# Patient Record
Sex: Female | Born: 1980 | Hispanic: Yes | Marital: Married | Smoking: Never smoker
Health system: Southern US, Community
[De-identification: ages and names within clinical notes are randomized; demographics above are authoritative.]

## PROBLEM LIST (undated history)

## (undated) DIAGNOSIS — E079 Disorder of thyroid, unspecified: Secondary | ICD-10-CM

## (undated) HISTORY — PX: FRACTURE SURGERY: SHX138

---

## 2009-07-07 ENCOUNTER — Ambulatory Visit: Payer: Self-pay | Admitting: Internal Medicine

## 2011-04-22 IMAGING — MG MAM [PERSON_NAME] DIAGNOSTIC W/CAD
1 series · 6 of 6 positions shown · non-contrast
Comparison: none

REASON FOR EXAM: Paulus N Ceejay  RT. BR PAIN Femyfefem [place] [phone]
COMMENTS:

[Series 37: R CC · right · 6 of 6 slices shown]
[im 1/6]
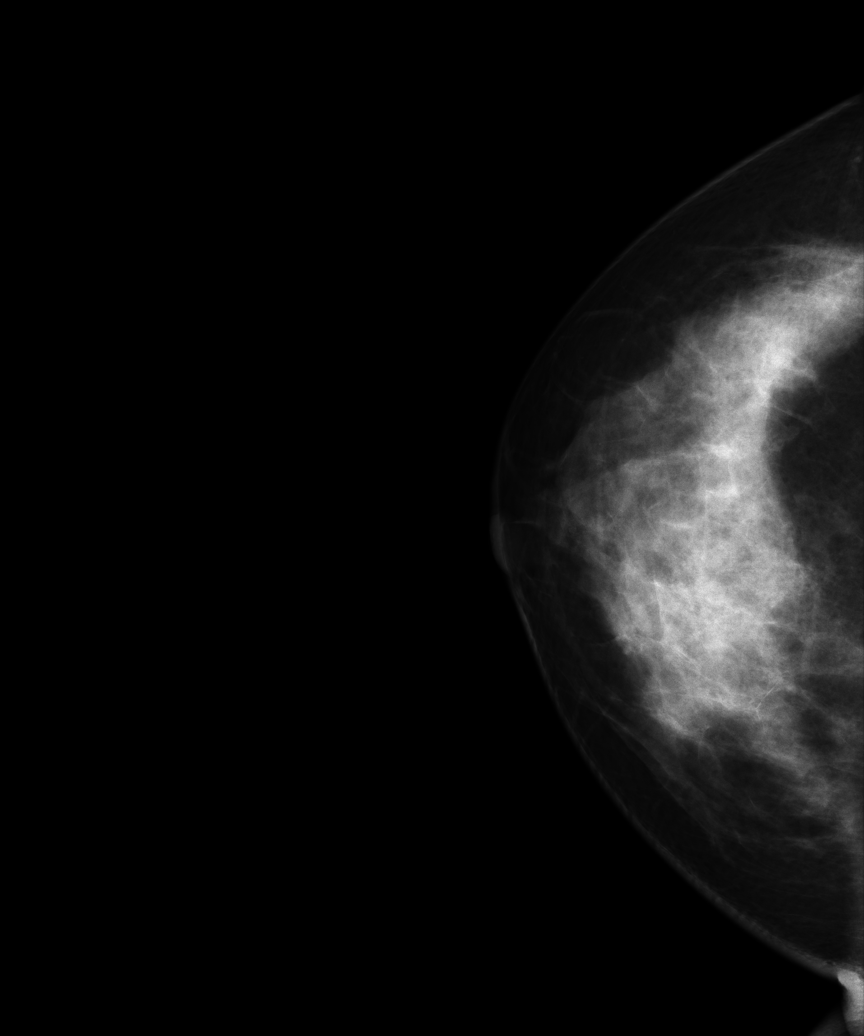
[im 2/6]
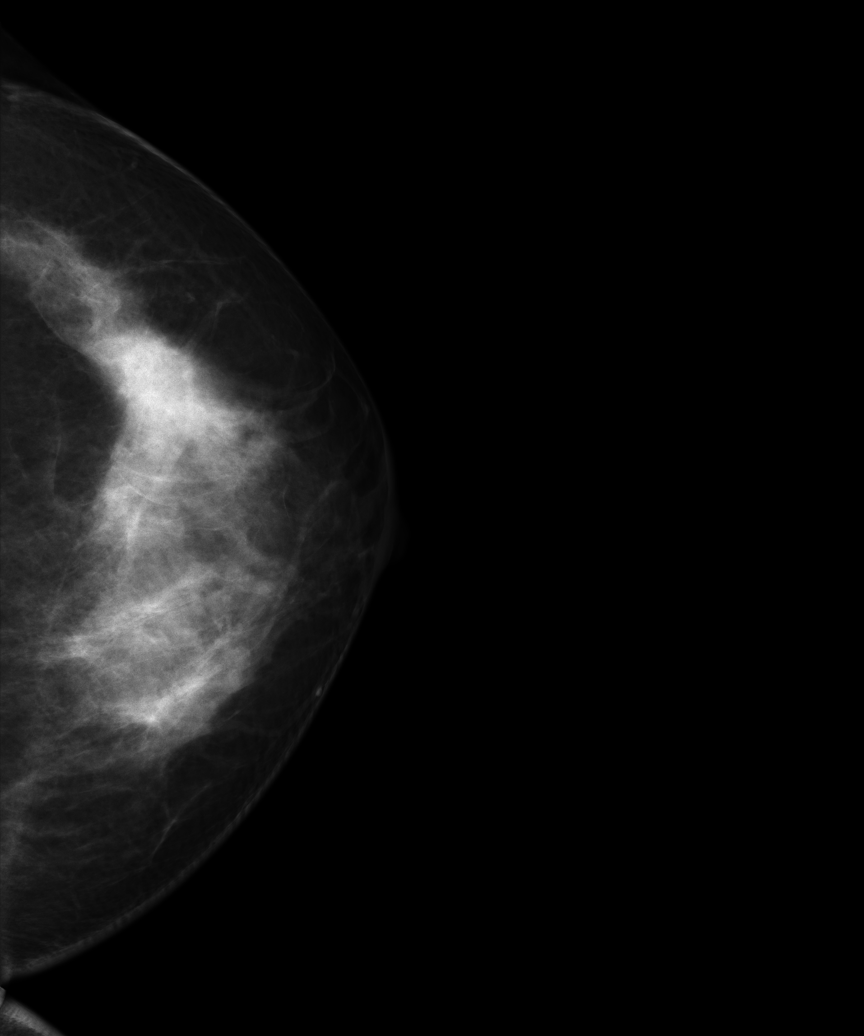
[im 3/6]
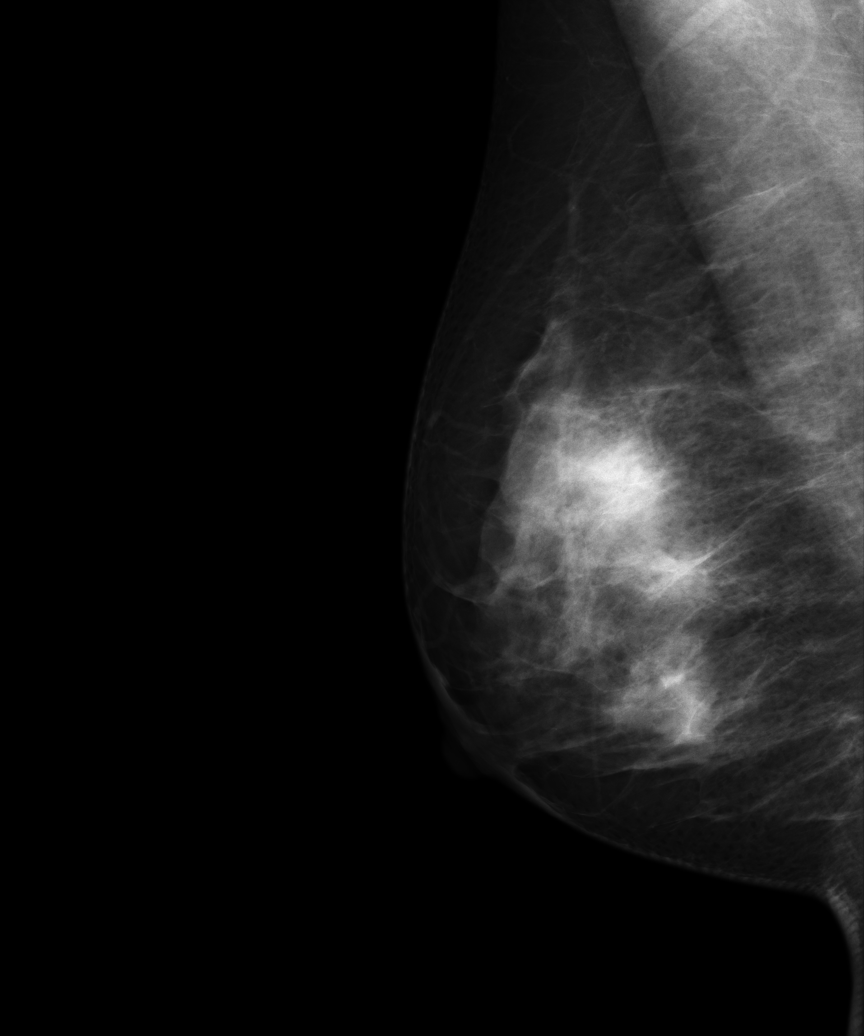
[im 4/6]
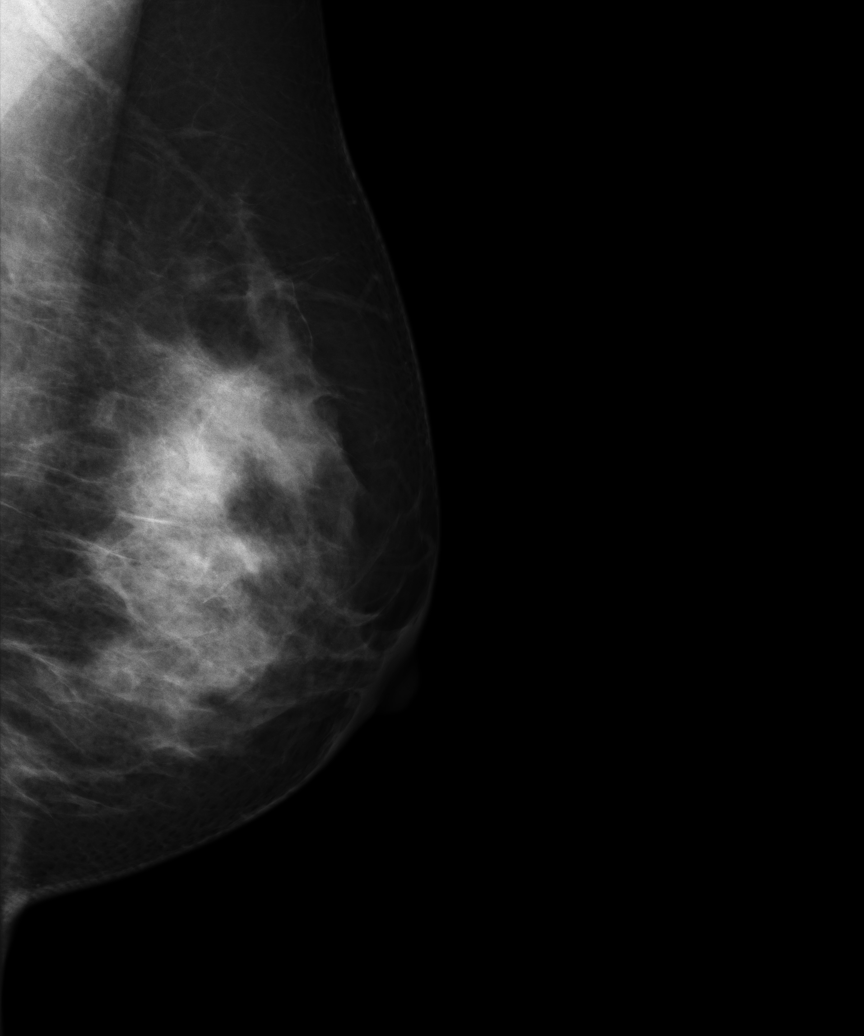
[im 5/6]
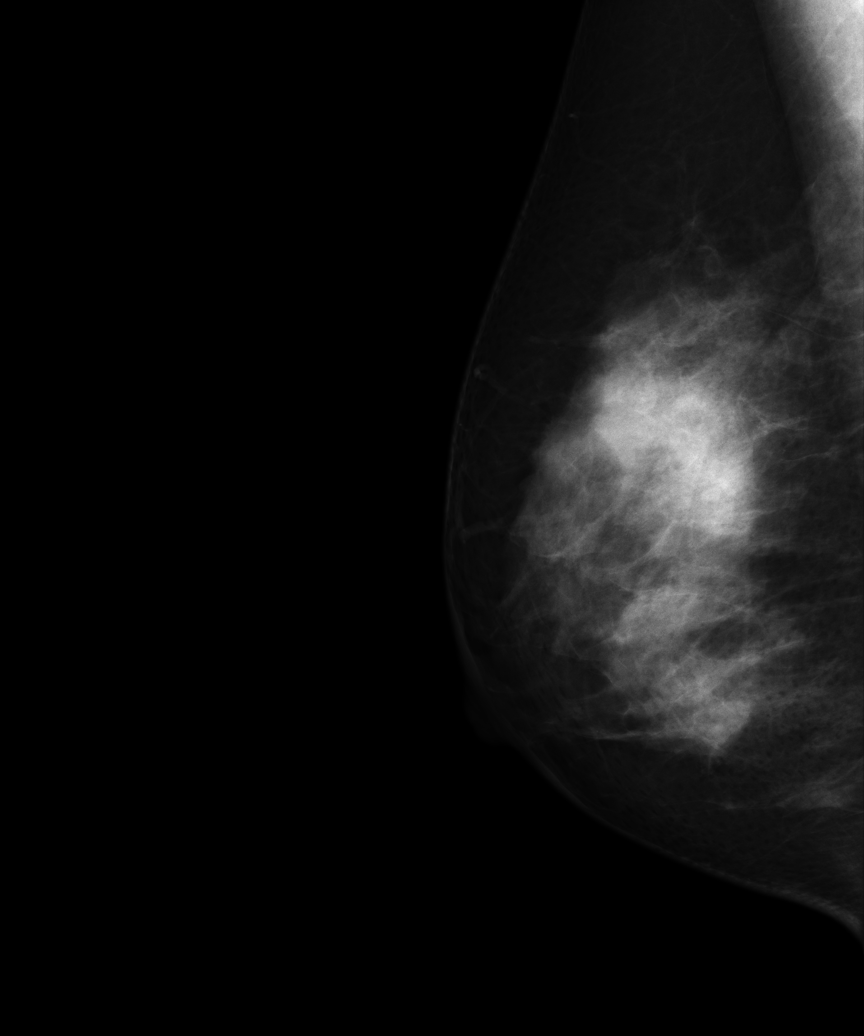
[im 6/6]
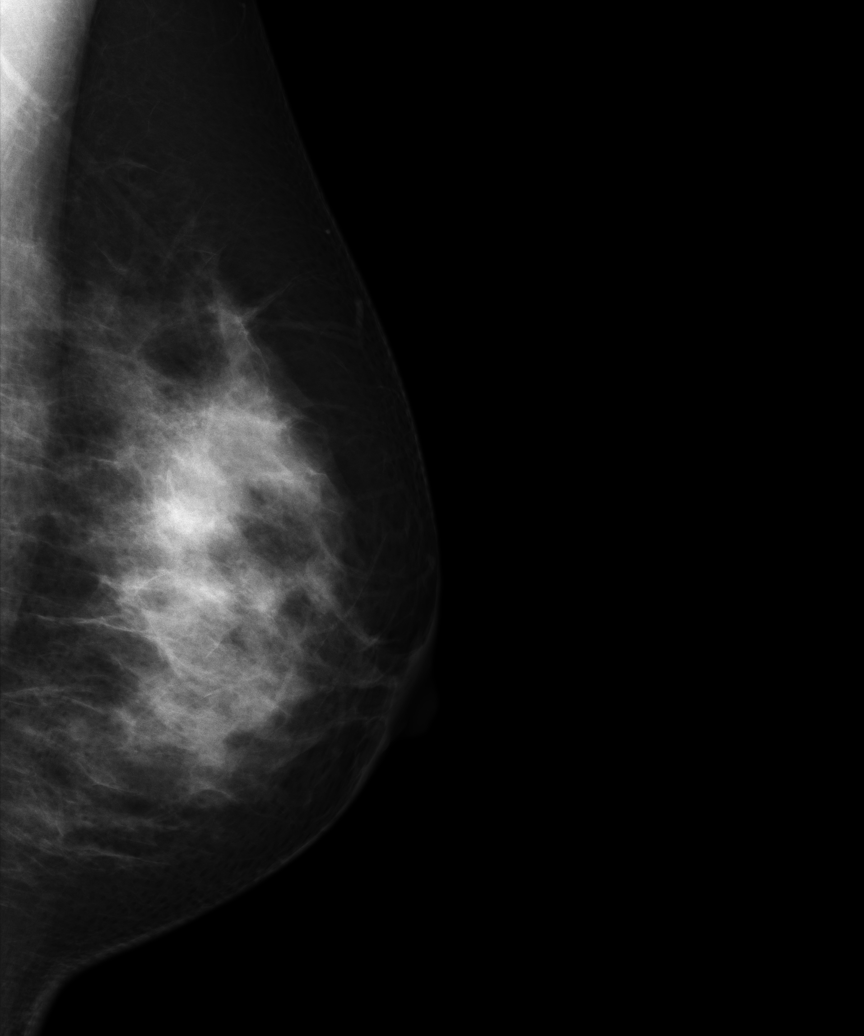

[6 of 6 positions shown; findings below may reference images not displayed]

PROCEDURE:     MAM - MAM MAFEE RESTAN DIAGNOSTIC W/CAD  - July 07, 2009 [DATE]

RESULT:       There are no prior exams available for comparison.

The breasts demonstrate a moderately to mildly severely dense parenchymal
pattern with a component of heterogeneity.  There is no mammographic
evidence to suggest malignancy.
IMPRESSION: BI-RADS:  Category 2- Benign Finding.

A negative mammogram report does not preclude biopsy or other evaluation of
a clinically palpable or otherwise suspicious mass or lesion. Breast cancer
may not be detected by mammography in up to 10% of cases.

## 2018-02-20 ENCOUNTER — Ambulatory Visit
Admission: EM | Admit: 2018-02-20 | Discharge: 2018-02-20 | Disposition: A | Discharge status: Home / Self Care | Payer: Self-pay | Attending: Family Medicine | Admitting: Family Medicine

## 2018-02-20 ENCOUNTER — Encounter: Payer: Self-pay | Admitting: Emergency Medicine

## 2018-02-20 ENCOUNTER — Other Ambulatory Visit: Payer: Self-pay

## 2018-02-20 DIAGNOSIS — M545 Low back pain, unspecified: Secondary | ICD-10-CM

## 2018-02-20 HISTORY — DX: Disorder of thyroid, unspecified: E07.9

## 2018-02-20 MED ORDER — MELOXICAM 15 MG PO TABS: 15.0000 mg | ORAL_TABLET | Freq: Every day | ORAL | 0 refills | Status: DC

## 2018-02-20 MED ORDER — CYCLOBENZAPRINE HCL 10 MG PO TABS: 10.0000 mg | ORAL_TABLET | Freq: Every day | ORAL | 0 refills | Status: DC

## 2018-02-20 NOTE — ED Triage Notes (Signed)
Pt c/o lower back pain. She reports that she went ice skating and fell 3 times. She reports that she lifts heavy things at work and had to leave work due to the pain. Injury occurred about 1 month ago. She has seen her PCP and treated with Naproxen without relief.

## 2018-02-20 NOTE — ED Provider Notes (Signed)
[facility]    CSN: [id number] Arrival date & time: 02/20/18  1310     History   Chief Complaint Chief Complaint  Patient presents with  . Back Pain    HPI Mallory Fox is a 37 y.o. female.   37 yo female with a c/o low back pain for 3 weeks since falling while ice skating. States that bending and lifting at work has not helped either. Patient has been seen for this at PCP's office and had negative lumbar x-rays. Denies any bowel/bladder problems, pain radiating, tingling, saddle anesthesia.   The history is provided by the patient.    Past Medical History:  Diagnosis Date  . Thyroid disease     There are no active problems to display for this patient.   Past Surgical History:  Procedure Laterality Date  . CESAREAN SECTION    . FRACTURE SURGERY      OB History   No obstetric history on file.      Home Medications    Prior to Admission medications   Medication Sig Start Date End Date Taking? Authorizing Provider  hydrOXYzine (ATARAX/VISTARIL) 25 MG tablet Take 25 mg by mouth daily.   Yes [provider]  levothyroxine (SYNTHROID, LEVOTHROID) 75 MCG tablet Take 75 mcg by mouth daily before breakfast.   Yes [provider]  naproxen (NAPROSYN) 500 MG tablet Take 500 mg by mouth 2 (two) times daily with a meal.   Yes [provider]  cyclobenzaprine (FLEXERIL) 10 MG tablet Take 1 tablet (10 mg total) by mouth at bedtime. 02/20/18   Payton Mccallumonty, Destine Ambroise, MD  meloxicam (MOBIC) 15 MG tablet Take 1 tablet (15 mg total) by mouth daily. 02/20/18   Payton Mccallumonty, Dotsie Gillette, MD    Family History Family History  Problem Relation Age of Onset  . Diabetes Mother   . Hypertension Father   . Hyperlipidemia Father     Social History Social History   Tobacco Use  . Smoking status: Never Smoker  . Smokeless tobacco: Never Used  Substance Use Topics  . Alcohol use: Yes    Frequency: Never  . Drug use: Never     Allergies     Sulfa antibiotics   Review of Systems Review of Systems   Physical Exam Triage Vital Signs ED Triage Vitals  Enc Vitals Group     BP 02/20/18 1334 112/76     Pulse Rate 02/20/18 1334 88     Resp 02/20/18 1334 18     Temp 02/20/18 1334 98.6 F (37 C)     Temp Source 02/20/18 1334 Oral     SpO2 02/20/18 1334 100 %     Weight 02/20/18 1326 165 lb (74.8 kg)     Height 02/20/18 1326 5\' 3"  (1.6 m)     Head Circumference --      Peak Flow --      Pain Score 02/20/18 1326 6     Pain Loc --      Pain Edu? --      Excl. in GC? --    No data found.  Updated Vital Signs BP 112/76 (BP Location: Left Arm)   Pulse 88   Temp 98.6 F (37 C) (Oral)   Resp 18   Ht 5\' 3"  (1.6 m)   Wt 74.8 kg   LMP 01/28/2018   SpO2 100%   BMI 29.23 kg/m   Visual Acuity Right Eye Distance:   Left Eye Distance:   Bilateral Distance:  Right Eye Near:   Left Eye Near:    Bilateral Near:     Physical Exam Vitals signs and nursing note reviewed.  Constitutional:      General: She is not in acute distress.    Appearance: She is well-developed. She is not diaphoretic.  Musculoskeletal:        General: Tenderness present.     Lumbar back: She exhibits tenderness and spasm. She exhibits normal range of motion, no bony tenderness, no swelling, no edema, no deformity, no laceration, no pain and normal pulse.  Skin:    General: Skin is warm and dry.     Findings: No erythema or rash.  Neurological:     Mental Status: She is alert.     Motor: No abnormal muscle tone.     Deep Tendon Reflexes: Reflexes are normal and symmetric. Reflexes normal.      UC Treatments / Results  Labs (all labs ordered are listed, but only abnormal results are displayed) Labs Reviewed - No data to display  EKG None  Radiology No results found.  Procedures Procedures (including critical care time)  Medications Ordered in UC Medications - No data to display  Initial Impression / Assessment and Plan /  UC Course  I have reviewed the triage vital signs and the nursing notes.  Pertinent labs & imaging results that were available during my care of the patient were reviewed by me and considered in my medical decision making (see chart for details).      Final Clinical Impressions(s) / UC Diagnoses   Final diagnoses:  Acute low back pain without sciatica, unspecified back pain laterality    ED Prescriptions    Medication Sig Dispense Auth. Provider   cyclobenzaprine (FLEXERIL) 10 MG tablet Take 1 tablet (10 mg total) by mouth at bedtime. 30 tablet Payton Mccallumonty, Jarelyn Bambach, MD   meloxicam (MOBIC) 15 MG tablet Take 1 tablet (15 mg total) by mouth daily. 30 tablet Payton Mccallumonty, Taesean Reth, MD     1. diagnosis reviewed with patient 2. rx as per orders above; reviewed possible side effects, interactions, risks and benefits  3. Recommend supportive treatment with heat/ice; no heavy lifting (over 5 lbs) 4. Follow up with PCP for further evaluation, possible referrals for MRI and/or specialist 5. Follow-up prn    Controlled Substance Prescriptions [place] Controlled Substance Registry consulted? Not Applicable   Payton Mccallumonty, Rashan Rounsaville, MD 02/20/18 1537

## 2018-03-01 HISTORY — PX: BREAST BIOPSY: SHX20

## 2018-11-24 ENCOUNTER — Ambulatory Visit: Payer: Self-pay

## 2018-11-24 ENCOUNTER — Telehealth: Payer: Self-pay

## 2018-11-24 NOTE — Telephone Encounter (Signed)
Client scheduled this pm in Nurse Clinic for polio titer. This is not a service provided by the [facility] and [facility] does not perform this test. Google search done and [facility] located on [address] in [place] (one not avialable in [place]). Phone call to [facility] and per female, client must bring MD order for polio titer, ID and insurance card (cost of test ~ $460). Appt is also needed. Above information given to client who states will get order from her doctor at [facility] and client states she does have insurance. Rich Number, RN

## 2019-02-06 ENCOUNTER — Other Ambulatory Visit: Payer: Self-pay | Admitting: Family Medicine

## 2019-02-06 DIAGNOSIS — Z1231 Encounter for screening mammogram for malignant neoplasm of breast: Secondary | ICD-10-CM

## 2019-02-14 ENCOUNTER — Inpatient Hospital Stay
Admission: RE | Admit: 2019-02-14 | Discharge: 2019-02-14 | Disposition: A | Discharge status: Home / Self Care | Payer: Self-pay | Source: Ambulatory Visit | Attending: Family Medicine | Admitting: Family Medicine

## 2019-02-14 ENCOUNTER — Other Ambulatory Visit: Payer: Self-pay | Admitting: Family Medicine

## 2019-02-14 ENCOUNTER — Other Ambulatory Visit: Payer: Self-pay | Admitting: *Deleted

## 2019-02-14 ENCOUNTER — Inpatient Hospital Stay
Admission: RE | Admit: 2019-02-14 | Discharge: 2019-02-14 | Disposition: A | Discharge status: Home / Self Care | Payer: Self-pay | Source: Ambulatory Visit | Attending: *Deleted | Admitting: *Deleted

## 2019-02-14 DIAGNOSIS — Z1231 Encounter for screening mammogram for malignant neoplasm of breast: Secondary | ICD-10-CM

## 2019-04-16 ENCOUNTER — Ambulatory Visit
Admission: RE | Admit: 2019-04-16 | Discharge: 2019-04-16 | Disposition: A | Discharge status: Home / Self Care | Payer: 59 | Source: Ambulatory Visit | Attending: Family Medicine | Admitting: Family Medicine

## 2019-04-16 DIAGNOSIS — Z1231 Encounter for screening mammogram for malignant neoplasm of breast: Secondary | ICD-10-CM | POA: Insufficient documentation

## 2019-05-30 ENCOUNTER — Ambulatory Visit: Payer: 59 | Attending: Internal Medicine

## 2019-05-30 DIAGNOSIS — Z23 Encounter for immunization: Secondary | ICD-10-CM

## 2019-05-30 NOTE — Progress Notes (Signed)
   Covid-19 Vaccination Clinic  Name:  Mallory Fox    MRN: [id number] DOB: 1980-07-15  05/30/2019  Ms. Mallory Fox was observed post Covid-19 immunization for 15 minutes without incident. She was provided with Vaccine Information Sheet and instruction to access the V-Safe system.   Ms. Mallory Fox was instructed to call 911 with any severe reactions post vaccine: Marland Kitchen Difficulty breathing  . Swelling of face and throat  . A fast heartbeat  . A bad rash all over body  . Dizziness and weakness   Immunizations Administered    Name Date Dose VIS Date Route   Pfizer COVID-19 Vaccine 05/30/2019 12:23 PM 0.3 mL 02/09/2019 Intramuscular   Manufacturer: [employer], [employer]   Lot: [id number]   NDC: [address]-3

## 2019-06-25 ENCOUNTER — Ambulatory Visit: Payer: 59 | Attending: Internal Medicine

## 2019-06-25 DIAGNOSIS — Z23 Encounter for immunization: Secondary | ICD-10-CM

## 2019-06-25 NOTE — Progress Notes (Signed)
   Covid-19 Vaccination Clinic  Name:  Mallory Fox    MRN: [id number] DOB: 1980-03-26  06/25/2019  Ms. Mallory Fox was observed post Covid-19 immunization for 15 minutes without incident. She was provided with Vaccine Information Sheet and instruction to access the V-Safe system.   Ms. Mallory Fox was instructed to call 911 with any severe reactions post vaccine: Marland Kitchen Difficulty breathing  . Swelling of face and throat  . A fast heartbeat  . A bad rash all over body  . Dizziness and weakness   Immunizations Administered    Name Date Dose VIS Date Route   Pfizer COVID-19 Vaccine 06/25/2019  8:59 AM 0.3 mL 04/25/2018 Intramuscular   Manufacturer: [employer], [employer]   Lot: [id number]   NDC: [address]-8

## 2021-02-03 ENCOUNTER — Other Ambulatory Visit: Payer: Self-pay

## 2021-02-03 ENCOUNTER — Encounter: Payer: Self-pay | Admitting: Emergency Medicine

## 2021-02-03 ENCOUNTER — Ambulatory Visit
Admission: EM | Admit: 2021-02-03 | Discharge: 2021-02-03 | Disposition: A | Discharge status: Home / Self Care | Payer: Managed Care, Other (non HMO)

## 2021-02-03 DIAGNOSIS — J302 Other seasonal allergic rhinitis: Secondary | ICD-10-CM

## 2021-02-03 DIAGNOSIS — J40 Bronchitis, not specified as acute or chronic: Secondary | ICD-10-CM | POA: Diagnosis not present

## 2021-02-03 NOTE — Discharge Instructions (Addendum)
Rest,push fluids,please take daily allergy med of your choice(Claritin, zyrtec, allegra). Continue allergy spray daily, use inhaler only for wheezing. We have referred you to pulmonologist for further evaluation. You were 100% on room air today in Urgent care, no respiratory distress or wheezing.

## 2021-02-03 NOTE — ED Triage Notes (Signed)
Pt c/o cough, wheezing. Started about 6 weeks ago. She states she has seen multiple providers and is still coughing and not feeling well. Denies fever.

## 2021-02-03 NOTE — ED Provider Notes (Signed)
[facility]    CSN: [id number] Arrival date & time: 02/03/21  1801      History   Chief Complaint Chief Complaint  Patient presents with   Cough    HPI Mallory Fox is a 40 y.o. female.   40 year old female, Mallory Fox, presents to urgent care chief complaint of coughing x6 weeks.  Patient states she works at [employer] services(PMH) and has seen several providers: PMH facility, [facility] and here. Pt has had chest xray that was negative. Pt states she has taken steroids, inhaler, cough medication, nasal spray and still coughs at work sometimes. Pt states her "coworkers are causing problems at work because I cough", pt wears mask at work all day. VSS in UC today, exam is essentially negative(no cough,no wheezing, R 18, sat 100% on RA. Pt states she needs a note to return to work.   The history is provided by the patient. No language interpreter was used.   Past Medical History:  Diagnosis Date   Thyroid disease     Patient Active Problem List   Diagnosis Date Noted   Bronchitis 02/03/2021   Seasonal allergies 02/03/2021    Past Surgical History:  Procedure Laterality Date   BREAST BIOPSY Right 2020   benign   CESAREAN SECTION     FRACTURE SURGERY      OB History   No obstetric history on file.      Home Medications    Prior to Admission medications   Medication Sig Start Date End Date Taking? Authorizing Provider  hydrOXYzine (ATARAX/VISTARIL) 25 MG tablet Take 25 mg by mouth daily.   Yes [provider]  levothyroxine (SYNTHROID, LEVOTHROID) 75 MCG tablet Take 75 mcg by mouth daily before breakfast.   Yes [provider]  cyclobenzaprine (FLEXERIL) 10 MG tablet Take 1 tablet (10 mg total) by mouth at bedtime. 02/20/18   Payton Mccallum, MD  meloxicam (MOBIC) 15 MG tablet Take 1 tablet (15 mg total) by mouth daily. 02/20/18   Payton Mccallum, MD  naproxen (NAPROSYN) 500 MG tablet Take 500 mg  by mouth 2 (two) times daily with a meal.    [provider]    Family History Family History  Problem Relation Age of Onset   Diabetes Mother    Hypertension Father    Hyperlipidemia Father    Breast cancer Sister 25    Social History Social History   Tobacco Use   Smoking status: Never   Smokeless tobacco: Never  Vaping Use   Vaping Use: Never used  Substance Use Topics   Alcohol use: Yes   Drug use: Never     Allergies   Sulfa antibiotics   Review of Systems Review of Systems  Respiratory:  Positive for cough, shortness of breath and wheezing.   Cardiovascular:  Negative for chest pain and palpitations.  All other systems reviewed and are negative.   Physical Exam Triage Vital Signs ED Triage Vitals  Enc Vitals Group     BP 02/03/21 1827 117/77     Pulse Rate 02/03/21 1827 83     Resp 02/03/21 1827 18     Temp 02/03/21 1827 99.1 F (37.3 C)     Temp Source 02/03/21 1827 Oral     SpO2 02/03/21 1827 100 %     Weight 02/03/21 1825 164 lb 14.5 oz (74.8 kg)     Height 02/03/21 1825 5\' 3"  (1.6 m)     Head Circumference --  Peak Flow --      Pain Score 02/03/21 1824 0     Pain Loc --      Pain Edu? --      Excl. in GC? --    No data found.  Updated Vital Signs BP 117/77 (BP Location: Left Arm)   Pulse 83   Temp 99.1 F (37.3 C) (Oral)   Resp 18   Ht 5\' 3"  (1.6 m)   Wt 164 lb 14.5 oz (74.8 kg)   LMP 01/14/2021 (Approximate)   SpO2 100%   BMI 29.21 kg/m   Visual Acuity Right Eye Distance:   Left Eye Distance:   Bilateral Distance:    Right Eye Near:   Left Eye Near:    Bilateral Near:     Physical Exam Vitals and nursing note reviewed.  Constitutional:      General: She is not in acute distress.    Appearance: She is well-developed and well-groomed.  HENT:     Head: Normocephalic.     Right Ear: Tympanic membrane is retracted.     Left Ear: Tympanic membrane is retracted.     Nose: Congestion present. No rhinorrhea.      Mouth/Throat:     Lips: Pink.     Mouth: Mucous membranes are moist.     Pharynx: Oropharynx is clear.  Eyes:     General: Lids are normal.     Extraocular Movements: Extraocular movements intact.     Conjunctiva/sclera: Conjunctivae normal.     Pupils: Pupils are equal, round, and reactive to light.  Neck:     Trachea: No tracheal deviation.  Cardiovascular:     Rate and Rhythm: Regular rhythm.     Pulses: Normal pulses.     Heart sounds: Normal heart sounds. No murmur heard. Pulmonary:     Effort: Pulmonary effort is normal.     Breath sounds: Normal breath sounds.  Abdominal:     General: Bowel sounds are normal.     Palpations: Abdomen is soft.     Tenderness: There is no abdominal tenderness.  Musculoskeletal:        General: Normal range of motion.     Cervical back: Normal range of motion.  Lymphadenopathy:     Cervical: No cervical adenopathy.  Skin:    General: Skin is warm and dry.     Findings: No rash.  Neurological:     General: No focal deficit present.     Mental Status: She is alert and oriented to person, place, and time.     GCS: GCS eye subscore is 4. GCS verbal subscore is 5. GCS motor subscore is 6.  Psychiatric:        Speech: Speech normal.        Behavior: Behavior normal. Behavior is cooperative.     UC Treatments / Results  Labs (all labs ordered are listed, but only abnormal results are displayed) Labs Reviewed - No data to display  EKG   Radiology No results found.  Procedures Procedures (including critical care time)  Medications Ordered in UC Medications - No data to display  Initial Impression / Assessment and Plan / UC Course  I have reviewed the triage vital signs and the nursing notes.  Pertinent labs & imaging results that were available during my care of the patient were reviewed by me and considered in my medical decision making (see chart for details).     Ddx: Chronic cough, bronchitis, allergies,RAD(asthma) Final  Clinical  Impressions(s) / UC Diagnoses   Final diagnoses:  Bronchitis  Seasonal allergies     Discharge Instructions      Rest,push fluids,please take daily allergy med of your choice(Claritin, zyrtec, allegra). Continue allergy spray daily, use inhaler only for wheezing. We have referred you to pulmonologist for further evaluation. You were 100% on room air today in Urgent care, no respiratory distress or wheezing.     ED Prescriptions   None    PDMP not reviewed this encounter.   Clancy Gourd, NP 02/03/21 1902

## 2021-02-12 ENCOUNTER — Other Ambulatory Visit: Payer: Self-pay | Admitting: Nurse Practitioner

## 2021-02-12 DIAGNOSIS — R059 Cough, unspecified: Secondary | ICD-10-CM

## 2021-03-09 ENCOUNTER — Telehealth: Payer: Self-pay | Admitting: Acute Care

## 2021-03-09 NOTE — Telephone Encounter (Signed)
Patient has left message on LCS voicemail.  Attempted to return call. NO answer. Left voicemail with phone number to call us back.

## 2021-03-13 NOTE — Telephone Encounter (Signed)
Returned call to patient regarding a question on LCS.  No answer. Left voicemail and call back number.  No active referral for patient. In review of medical information, patient's smoking history is documented as 'never smoked.'

## 2021-03-27 ENCOUNTER — Ambulatory Visit (INDEPENDENT_AMBULATORY_CARE_PROVIDER_SITE_OTHER): Payer: Managed Care, Other (non HMO) | Admitting: Internal Medicine

## 2021-03-27 ENCOUNTER — Other Ambulatory Visit
Admission: RE | Admit: 2021-03-27 | Discharge: 2021-03-27 | Disposition: A | Discharge status: Home / Self Care | Payer: Managed Care, Other (non HMO) | Source: Ambulatory Visit | Attending: Internal Medicine | Admitting: Internal Medicine

## 2021-03-27 ENCOUNTER — Encounter: Payer: Self-pay | Admitting: Internal Medicine

## 2021-03-27 ENCOUNTER — Other Ambulatory Visit: Payer: Self-pay

## 2021-03-27 DIAGNOSIS — R053 Chronic cough: Secondary | ICD-10-CM | POA: Diagnosis present

## 2021-03-27 LAB — CBC WITH DIFFERENTIAL/PLATELET
Abs Immature Granulocytes: 0.01 10*3/uL (ref 0.00–0.07)
Basophils Absolute: 0.1 10*3/uL (ref 0.0–0.1)
Basophils Relative: 1 %
Eosinophils Absolute: 0.3 10*3/uL (ref 0.0–0.5)
Eosinophils Relative: 5 %
HCT: 39.4 % (ref 36.0–46.0)
Hemoglobin: 12.9 g/dL (ref 12.0–15.0)
Immature Granulocytes: 0 %
Lymphocytes Relative: 32 %
Lymphs Abs: 2 10*3/uL (ref 0.7–4.0)
MCH: 27.1 pg (ref 26.0–34.0)
MCHC: 32.7 g/dL (ref 30.0–36.0)
MCV: 82.8 fL (ref 80.0–100.0)
Monocytes Absolute: 0.5 10*3/uL (ref 0.1–1.0)
Monocytes Relative: 7 %
Neutro Abs: 3.4 10*3/uL (ref 1.7–7.7)
Neutrophils Relative %: 55 %
Platelets: 234 10*3/uL (ref 150–400)
RBC: 4.76 MIL/uL (ref 3.87–5.11)
RDW: 13.6 % (ref 11.5–15.5)
WBC: 6.2 10*3/uL (ref 4.0–10.5)
nRBC: 0 % (ref 0.0–0.2)

## 2021-03-27 MED ORDER — DULERA 100-5 MCG/ACT IN AERO: INHALATION_SPRAY | RESPIRATORY_TRACT | 11 refills | Status: DC

## 2021-03-27 MED ORDER — PREDNISONE 10 MG PO TABS: ORAL_TABLET | ORAL | 0 refills | Status: DC

## 2021-03-27 MED ORDER — ALBUTEROL SULFATE HFA 108 (90 BASE) MCG/ACT IN AERS: INHALATION_SPRAY | RESPIRATORY_TRACT | 1 refills | Status: DC

## 2021-03-27 NOTE — Patient Instructions (Addendum)
Plan A = Automatic = Always=   Dulera 100 Take 2 puffs first thing in am and then another 2 puffs about 12 hours later.    Work on inhaler technique:  relax and gently blow all the way out then take a nice smooth full deep breath back in, triggering the inhaler at same time you start breathing in.  Hold for up to 5 seconds if you can. Blow out thru nose. Rinse and gargle with water when done.  If mouth or throat bother you at all,  try brushing teeth/gums/tongue with arm and hammer toothpaste/ make a slurry and gargle and spit out.   Prednisone 10 mg take  4 each am x 2 days,   2 each am x 2 days,  1 each am x 2 days and stop   Plan B = Backup (to supplement plan A, not to replace it) Only use your albuterol inhaler as a rescue medication to be used if you can't catch your breath by resting or doing a relaxed purse lip breathing pattern.  - The less you use it, the better it will work when you need it. - Ok to use the inhaler up to 2 puffs  every 4 hours if you must but call for appointment if use goes up over your usual need - Don't leave home without it !!  (think of it like the spare tire for your car)   We will walk you before you go to check your exercise tolerance  Please remember to go to the lab department   for your tests - we will call you with the results when they are available.      Please schedule a follow up office visit in 4 weeks, sooner if needed  with all medications /inhalers/ solutions in hand so we can verify exactly what you are taking. This includes all medications from all doctors and over the counters

## 2021-03-27 NOTE — Progress Notes (Signed)
Mallory KavaAdriana Mallory Fox Mallory Fox, female    DOB: 1980/08/23, 41 y.o.   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41 with one episode bad cough in [place] requiring 3-4 m of "shots" and felt fine until [facility]  [facility] [place] [facility]  03/27/2021 by Clancy GourdJeanette Defelice NP  for cough       History of Present Illness  03/27/2021  Pulmonary/ 1st office eval/ Mallory Mallory Fox / [place] [facility]  Chief Complaint  Patient presents with   Consult  Dyspnea:  couple hundred feet  Cough: cold air/ at hs x 5- 10 min then better, no flare in am  Sleep: bed is flat/ one pillow  SABA use: albuterol seems to help uses it up to 2-3 /day   No obvious other patterns in day to day or daytime variability  of cough or assoc excess/ purulent sputum or mucus plugs or hemoptysis or cp or chest tightness, subjective wheeze or overt sinus or hb symptoms.     Also denies any obvious fluctuation of symptoms with weather or environmental changes or other aggravating or alleviating factors except as outlined above   No unusual exposure hx or h/o childhood pna/ asthma or knowledge of premature birth.  Current Allergies, Complete Past Medical History, Past Surgical History, Family History, and Social History were reviewed in [facility] electronic medical record.  ROS  The following are not active complaints unless bolded Hoarseness, sore throat, dysphagia, dental problems, itching, sneezing,  nasal congestion or discharge of excess mucus or purulent secretions, ear ache,   fever, chills, sweats, unintended wt loss or wt gain, classically pleuritic or exertional cp,  orthopnea pnd or arm/hand swelling  or leg swelling, presyncope, palpitations, abdominal pain, anorexia, nausea, vomiting, diarrhea  or change in bowel habits or change in bladder habits, change in stools or  change in urine, dysuria, hematuria,  rash, arthralgias, visual complaints, headache, numbness, weakness or ataxia or problems with walking or coordination,  change in mood or  memory.           Past Medical History:  Diagnosis Date   Thyroid disease     Outpatient Medications Prior to Visit  Medication Sig Dispense Refill   Albuterol Sulfate (PROVENTIL HFA IN) Inhale into the lungs.     Calcium 200 MG TABS Take by mouth.     cyclobenzaprine (FLEXERIL) 10 MG tablet Take 1 tablet (10 mg total) by mouth at bedtime. 30 tablet 0   hydrOXYzine (ATARAX/VISTARIL) 25 MG tablet Take 25 mg by mouth daily.     levothyroxine (SYNTHROID, LEVOTHROID) 75 MCG tablet Take 75 mcg by mouth daily before breakfast.         0             Objective:     BP 100/60 (BP Location: Left Arm, Patient Position: Sitting, Cuff Size: Normal)    Pulse 82    Temp 98.7 F (37.1 C) (Oral)    Ht 5\' 3"  (1.6 m)    Wt 172 lb 3.2 oz (78.1 kg)    SpO2 97%    BMI 30.50 kg/m   SpO2: 97 %  RA  Amb pleasant latina in NAD   HEENT : pt wearing mask not removed for exam due to covid -19 concerns.    NECK :  without JVD/Nodes/TM/ nl carotid upstrokes bilaterally   LUNGS: no acc muscle use,  Nl contour chest which is clear to A and P bilaterally without cough on insp or exp maneuvers   CV:  RRR  no s3 or murmur or increase in P2, and no edema   ABD:  soft and nontender with nl inspiratory excursion in the supine position. No bruits or organomegaly appreciated, bowel sounds nl  MS:  Nl gait/ ext warm without deformities, calf tenderness, cyanosis or clubbing No obvious joint restrictions   SKIN: warm and dry without lesions    NEURO:  alert, approp, nl sensorium with  no motor or cerebellar deficits apparent.       I  reviewed  radiology impression in care everywhere as follows:  CXR:   pa and lat 01/05/21  Wnl     Assessment   Chronic cough Onset  Jan 05 2021 responsive to albuterol  - Allergy profile  03/27/2021 >  Eos 0.3 /  IgE  - 03/27/2021 dulera 100  Take 2 puffs first thing in am and then another 2 puffs about 12 hours later  - 03/27/2021   Walked on RA  x  3  lap(s) =  approx 525  ft  @ mod fast pace, stopped due to end of study with mod sob  with lowest 02 sats 94%   The most common causes of chronic cough in immunocompetent adults include the following: upper airway cough syndrome (UACS), previously referred to as postnasal drip syndrome (PNDS), which is caused by variety of rhinosinus conditions; (2) asthma; (3) GERD; (4) chronic bronchitis from cigarette smoking or other inhaled environmental irritants; (5) nonasthmatic eosinophilic bronchitis; and (6) bronchiectasis.   These conditions, singly or in combination, have accounted for up to 94% of the causes of chronic cough in prospective studies.   Other conditions have constituted no >6% of the causes in prospective studies These have included bronchogenic carcinoma, chronic interstitial pneumonia, sarcoidosis, left ventricular failure, ACEI-induced cough, and aspiration from a condition associated with pharyngeal dysfunction.    Chronic cough is often simultaneously caused by more than one condition. A single cause has been found from 38 to 82% of the time, multiple causes from 18 to 62%. Multiply caused cough has been the result of three diseases up to 42% of the time.   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 Related to heavy exp to wood fire smoke as child/young adult and will start with dulera 100 Take 2 puffs first thing in am and then another 2 puffs about 12 hours later plus Prednisone 10 mg take  4 each am x 2 days,   2 each am x 2 days,  1 each am x 2 days and stop   Will also explore for allergy component as above and f/u in 4 weeks with all meds in hand using a trust but verify approach to confirm accurate Medication  Reconciliation The principal here is that until we are certain that the  patients are doing what we've asked, it makes  no sense to ask them to do more.            Each maintenance medication was reviewed in detail including emphasizing most importantly the difference between maintenance and prns and under what circumstances the prns are to be triggered using an action plan format where appropriate.  Total time for H and P, chart review, counseling, reviewing hfadevice(s) , directly observing portions of ambulatory 02 saturation study/ and  generating customized AVS unique to this new pt  office visit / same day charting = 52 min                           Christinia Gully, MD 03/27/2021

## 2021-03-27 NOTE — Assessment & Plan Note (Addendum)
Onset  Jan 05 2021 responsive to albuterol  - Allergy profile 03/27/2021 >  Eos 0.3 /  IgE  - 03/27/2021 dulera 100  Take 2 puffs first thing in am and then another 2 puffs about 12 hours later  - 03/27/2021   Walked on RA  x  3  lap(s) =  approx 525  ft  @ mod fast pace, stopped due to end of study with mod sob  with lowest 02 sats 94%   The most common causes of chronic cough in immunocompetent adults include the following: upper airway cough syndrome (UACS), previously referred to as postnasal drip syndrome (PNDS), which is caused by variety of rhinosinus conditions; (2) asthma; (3) GERD; (4) chronic bronchitis from cigarette smoking or other inhaled environmental irritants; (5) nonasthmatic eosinophilic bronchitis; and (6) bronchiectasis.   These conditions, singly or in combination, have accounted for up to 94% of the causes of chronic cough in prospective studies.   Other conditions have constituted no >6% of the causes in prospective studies These have included bronchogenic carcinoma, chronic interstitial pneumonia, sarcoidosis, left ventricular failure, ACEI-induced cough, and aspiration from a condition associated with pharyngeal dysfunction.    Chronic cough is often simultaneously caused by more than one condition. A single cause has been found from 38 to 82% of the time, multiple causes from 18 to 62%. Multiply caused cough has been the result of three diseases up to 42% of the time.   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 Related to heavy exp to wood fire smoke as child/young adult and will start with dulera 100 Take 2 puffs first thing in am and then another 2 puffs about 12 hours later plus Prednisone 10 mg take  4 each am x 2 days,   2 each am x 2 days,  1 each am x 2 days and stop   Will also explore for allergy component as above and f/u in 4 weeks with all meds in hand using a trust but verify approach to confirm accurate Medication  Reconciliation The principal here is that until we are  certain that the  patients are doing what we've asked, it makes no sense to ask them to do more.            Each maintenance medication was reviewed in detail including emphasizing most importantly the difference between maintenance and prns and under what circumstances the prns are to be triggered using an action plan format where appropriate.  Total time for H and P, chart review, counseling, reviewing hfadevice(s) , directly observing portions of ambulatory 02 saturation study/ and generating customized AVS unique to this new pt  office visit / same day charting = 52 min

## 2021-04-01 LAB — IGE: IgE (Immunoglobulin E), Serum: 60 IU/mL (ref 6–495)

## 2021-05-08 ENCOUNTER — Institutional Professional Consult (permissible substitution): Payer: Managed Care, Other (non HMO) | Admitting: Internal Medicine

## 2021-05-08 ENCOUNTER — Other Ambulatory Visit: Payer: Self-pay

## 2021-05-08 ENCOUNTER — Ambulatory Visit: Payer: Managed Care, Other (non HMO) | Admitting: Internal Medicine

## 2021-05-08 ENCOUNTER — Encounter: Payer: Self-pay | Admitting: Internal Medicine

## 2021-05-08 DIAGNOSIS — R053 Chronic cough: Secondary | ICD-10-CM

## 2021-05-08 MED ORDER — PREDNISONE 10 MG PO TABS: ORAL_TABLET | ORAL | 0 refills | Status: DC

## 2021-05-08 MED ORDER — FAMOTIDINE 20 MG PO TABS: ORAL_TABLET | ORAL | 11 refills | Status: DC

## 2021-05-08 MED ORDER — PANTOPRAZOLE SODIUM 40 MG PO TBEC: 40.0000 mg | DELAYED_RELEASE_TABLET | Freq: Every day | ORAL | 2 refills | Status: DC

## 2021-05-08 NOTE — Patient Instructions (Addendum)
Continue dulera 100 Take 2 puffs first thing in am and then another 2 puffs about 12 hours later.  ?  ?Work on inhaler technique:  relax and gently blow all the way out then take a nice smooth full deep breath back in, triggering the inhaler at same time you start breathing in.  Hold for up to 5 seconds if you can. Blow out thru nose. Rinse and gargle with water when done.  If mouth or throat bother you at all,  try brushing teeth/gums/tongue with arm and hammer toothpaste/ make a slurry and gargle and spit out.  ? ?   ?Zyrtec (over the counter) 10 mg each am (unless it makes you sleepy in which case you should take clariton) ? ?For drainage / throat tickle try take CHLORPHENIRAMINE  4 mg  ("Allergy Relief" 4mg   at [facility] should be easiest to find in the blue box usually on bottom shelf)  take one every 4 hours as needed - extremely effective and inexpensive over the counter- may cause drowsiness so start with just a dose or two an hour before bedtime and see how you tolerate it before trying in daytime.  ? ?GERD (REFLUX)  is an extremely common cause of respiratory symptoms just like yours , many times with no obvious heartburn at all.  ? ? It can be treated with medication, but also with lifestyle changes including elevation of the head of your bed (ideally with 6 -8inch blocks under the headboard of your bed),  Smoking cessation, avoidance of late meals, excessive alcohol, and avoid fatty foods, chocolate, peppermint, colas, red wine, and acidic juices such as orange juice.  ?NO MINT OR MENTHOL PRODUCTS SO NO COUGH DROPS  ?USE SUGARLESS CANDY INSTEAD (Jolley ranchers or Stover's or Life Savers) or even ice chips will also do - the key is to swallow to prevent all throat clearing. ?NO OIL BASED VITAMINS - use powdered substitutes.  Avoid fish oil when coughing.  ? ?Prednisone 10 mg take  4 each am x 2 days,   2 each am x 2 days,  1 each am x 2 days and stop  ? ?Pantoprazole (protonix) 40 mg   Take   30-60 min before first meal of the day and Pepcid (famotidine)  20 mg after supper until return to office - this is the best way to tell whether stomach acid is contributing to your problem.   ? ? ?Follow up with Dr [facility] in   office visit in 4 weeks, sooner if needed  with all medications /inhalers/ solutions in hand so we can verify exactly what you are taking. This includes all medications from all doctors and over the counters  ?

## 2021-05-08 NOTE — Assessment & Plan Note (Signed)
Onset  Jan 05 2021 responsive to albuterol / prednisone  ?- Allergy screen  03/27/2021 >  Eos 0.3 /  IgE 60 ?- 03/27/2021 dulera 100  Take 2 puffs first thing in am and then another 2 puffs about 12 hours later  ?- 03/27/2021   Walked on RA  x  3  lap(s) =  approx 525  ft  @ mod fast pace, stopped due to end of study with mod sob  with lowest 02 sats 94%  ?-  05/08/2021  After extensive coaching inhaler device,  effectiveness =    50% p coaching > continue dulera 100 2 bid and max rx for gerd/ 1st gen H1 blockers per guidelines  And f/u with Dr Alveta Heimlich in 4 weeks with all meds in hand  ? ?ddx is AB vs Upper airway cough syndrome (previously labeled PNDS),  is so named because it's frequently impossible to sort out how much is  CR/sinusitis with freq throat clearing (which can be related to primary GERD)   vs  causing  secondary (" extra esophageal")  GERD from wide swings in gastric pressure that occur with throat clearing, often  promoting self use of mint and menthol lozenges that reduce the lower esophageal sphincter tone and exacerbate the problem further in a cyclical fashion.  ? ?These are the same pts (now being labeled as having "irritable larynx syndrome" by some cough centers) who not infrequently have a history of having failed to tolerate ace inhibitors,  dry powder inhalers or biphosphonates or report having atypical/extraesophageal reflux symptoms (eg globus sensation)  that don't respond to standard doses of PPI  and are easily confused as having aecopd or asthma flares by even experienced allergists/ pulmonologists (myself included).  ? ?rec rx as above and return in 4 weeks with all meds in hand using a trust but verify approach to confirm accurate Medication  Reconciliation The principal here is that until we are certain that the  patients are doing what we've asked, it makes no sense to ask them to do more.  ? ?Advised: ?The standardized cough guidelines published in Chest by [place] in 2006  are still the best available and consist of a multiple step process (up to 12!) , not a single office visit,  and are intended  to address this problem logically,  with an alogrithm dependent on response to empiric treatment at  each progressive step  to determine a specific diagnosis with  minimal addtional testing needed. Therefore if adherence is an issue or can't be accurately verified,  it's very unlikely the standard evaluation and treatment will be successful here.   ? ?Furthermore, response to therapy (other than acute cough suppression, which should only be used short term with avoidance of narcotic containing cough syrups if possible), can be a gradual process for which the patient is not likely to  perceive immediate benefit. ? ?Unlike going to an eye doctor where the best perscription is almost always the first one and is immediately effective, this is almost never the case in the management of chronic cough syndromes. Therefore the patient needs to commit up front to consistently adhere to recommendations  for up to 6 weeks of therapy directed at the likely underlying problem(s) before the response can be reasonably evaluated.  ? ? ?    ?  ? ?Each maintenance medication was reviewed in detail including emphasizing most importantly the difference between maintenance and prns and under what circumstances the prns are to  be triggered using an action plan format where appropriate. ? ?Total time for H and P, chart review, counseling,  and generating customized AVS unique to this office visit / same day charting  > 30 min  ?     ?

## 2021-05-08 NOTE — Progress Notes (Signed)
? ?Mallory Fox, female    DOB: 1980-11-12, 41 y.o.   MRN: [id number] ? ? ?Brief patient profile:  ?41  yo [place]  never smoker but exposed to wood fires for cooking until age 34 with one episode bad cough in [place] requiring 3-4 m of "shots" and felt fine until 2021 Dec seen in [facility] for bronchitis rx inhaler prn then Jan 05 2021 [employer] doc proventil  referred to pulmonary clinic in [facility]  03/27/2021 by Mallory Gourd NP  for cough   ? ? ? ? ?History of Present Illness  ?03/27/2021  Pulmonary/ 1st office eval/ Mallory Fox / [employer]  ?Chief Complaint  ?Patient presents with  ? Consult  ?Dyspnea:  couple hundred feet  ?Cough: cold air/ at hs x 5- 10 min then better, no flare in am  ?Sleep: bed is flat/ one pillow  ?SABA use: albuterol seems to help uses it up to 2-3 /day  ?Rec ?Plan A = Automatic = Always=   Dulera 100 Take 2 puffs first thing in am and then another 2 puffs about 12 hours later.  ?Work on inhaler technique: ?Prednisone 10 mg take  4 each am x 2 days,   2 each am x 2 days,  1 each am x 2 days and stop  ?Plan B = Backup (to supplement plan A, not to replace it) ?Only use your albuterol inhaler as a rescue medication ?Please remember to go to the lab department  03/27/2021 >  Eos 0.3 /  IgE 60 ?Please schedule a follow up office visit in 4 weeks, sooner if needed  with all medications /inhalers/ solutions in hand  ? ? ?05/08/2021  f/u ov/Mallory Fox/ [facility] re: cough x nov 2022  maint on dulera 100 but poor hfa/ 90% better p rx with prednisone  / dulera is on 0  ?Chief Complaint  ?Patient presents with  ? Follow-up  ?Dyspnea:  better only while on pred then gradually worse / still using cough drops ?Cough: worse when at work / clear min mucus production ?Sleeping: bed is flat  ?SABA use: poor  ?02: none  ?Covid status: vax x 2  ? ? ?No other obvious patterns in day to day or daytime variability or assoc excess/ purulent sputum or mucus plugs or hemoptysis or cp or chest tightness, subjective  wheeze or overt sinus or hb symptoms.  ? ? Also denies any obvious fluctuation of symptoms with weather or environmental changes or other aggravating or alleviating factors except as outlined above  ? ?No unusual exposure hx or h/o childhood pna/ asthma or knowledge of premature birth. ? ?Current Allergies, Complete Past Medical History, Past Surgical History, Family History, and Social History were reviewed in [employer] record. ? ?ROS  The following are not active complaints unless bolded ?Hoarseness, sore throat, dysphagia, dental problems, itching, sneezing,  nasal congestion or discharge of excess mucus or purulent secretions, ear ache,   fever, chills, sweats, unintended wt loss or wt gain, classically pleuritic or exertional cp,  orthopnea pnd or arm/hand swelling  or leg swelling, presyncope, palpitations, abdominal pain, anorexia, nausea, vomiting, diarrhea  or change in bowel habits or change in bladder habits, change in stools or change in urine, dysuria, hematuria,  rash, arthralgias, visual complaints, headache, numbness, weakness or ataxia or problems with walking or coordination,  change in mood or  memory. ?      ? ?Current Meds - - NOTE:   Unable to verify as accurately  reflecting what pt takes    ?Medication Sig  ? albuterol (PROAIR HFA) 108 (90 Base) MCG/ACT inhaler 2 puffs every 4 hours as needed only  if your can't catch your breath  ? Calcium 200 MG TABS Take by mouth.  ? cyclobenzaprine (FLEXERIL) 10 MG tablet Take 1 tablet (10 mg total) by mouth at bedtime.  ? hydrOXYzine (ATARAX/VISTARIL) 25 MG tablet Take 25 mg by mouth daily.  ? levothyroxine (SYNTHROID, LEVOTHROID) 75 MCG tablet Take 75 mcg by mouth daily before breakfast.  ? mometasone-formoterol (DULERA) 100-5 MCG/ACT AERO Take 2 puffs first thing in am and then another 2 puffs about 12 hours later.  ?     ?     ? ?  ?   ? ?Past Medical History:  ?Diagnosis Date  ? Thyroid disease   ? ?  ? ? ? ?Objective:  ?   ? ?Wt Readings from Last 3 Encounters:  ?05/08/21 174 lb 9.6 oz (79.2 kg)  ?03/27/21 172 lb 3.2 oz (78.1 kg)  ?02/03/21 164 lb 14.5 oz (74.8 kg)  ?  ? ? ?Vital signs reviewed  05/08/2021  - Note at rest 02 sats  97% on RA  ? ?General appearance:   somb amb latina, min rattling on coughing fits not reproduced on insp/exp ? ? HEENT : pt wearing mask not removed for exam due to covid -19 concerns.  ? ? ?NECK :  without JVD/Nodes/TM/ nl carotid upstrokes bilaterally ? ? ?LUNGS: no acc muscle use,  Nl contour chest which is clear to A and P bilaterally without cough on insp or exp maneuvers ? ? ?CV:  RRR  no s3 or murmur or increase in P2, and no edema  ? ?ABD:  soft and nontender with nl inspiratory excursion in the supine position. No bruits or organomegaly appreciated, bowel sounds nl ? ?MS:  Nl gait/ ext warm without deformities, calf tenderness, cyanosis or clubbing ?No obvious joint restrictions  ? ?SKIN: warm and dry without lesions   ? ?NEURO:  alert, approp, nl sensorium with  no motor or cerebellar deficits apparent.  ? ? ?I personally reviewed images and a  radiology impression as follows:  ?CXR:   01/05/21 with same symptoms as now per pt ?WNL    ? ? ? ? ?   ?Assessment  ? ? ? ? ?    ?  ?     ?  ?      ?     ?   ?

## 2021-06-12 ENCOUNTER — Ambulatory Visit: Payer: Managed Care, Other (non HMO) | Admitting: Pulmonary Disease

## 2021-07-08 ENCOUNTER — Telehealth: Payer: Self-pay | Admitting: Internal Medicine

## 2021-07-08 NOTE — Telephone Encounter (Signed)
Spoke to Mallory Fox with [employer] center. She is requesting last OV note.  ?Note has been faxed to [phone] as requested. ?Nothing further needed.  ?

## 2021-07-24 ENCOUNTER — Ambulatory Visit: Payer: Managed Care, Other (non HMO) | Admitting: Pulmonary Disease

## 2021-07-24 ENCOUNTER — Encounter: Payer: Self-pay | Admitting: Pulmonary Disease

## 2021-07-24 VITALS — BP 128/72 | HR 68 | Temp 97.6°F | Ht 63.0 in | Wt 176.6 lb

## 2021-07-24 DIAGNOSIS — R062 Wheezing: Secondary | ICD-10-CM

## 2021-07-24 DIAGNOSIS — R053 Chronic cough: Secondary | ICD-10-CM | POA: Diagnosis not present

## 2021-07-24 DIAGNOSIS — K219 Gastro-esophageal reflux disease without esophagitis: Secondary | ICD-10-CM | POA: Diagnosis not present

## 2021-07-24 MED ORDER — OMEPRAZOLE 40 MG PO CPDR: 40.0000 mg | DELAYED_RELEASE_CAPSULE | Freq: Every day | ORAL | 3 refills | Status: DC

## 2021-07-24 NOTE — Patient Instructions (Signed)
I think your cough is related to your very bad reflux.  We are referring you to the gastroenterologist (GI) so that your reflux can be evaluated more thoroughly.  We are prescribing a different medication for your reflux.  While all of the antireflux medicines can affect the absorption of your thyroid medicine, make sure you take them at different times of the day.  This effect is not always present.  It sometimes takes 6 months for the effect to be seen.  I think it is important now to control your reflux as this is affecting your lungs by causing wheezing and cough.  Your primary care doctor should check your thyroid levels more frequently while on the reflux medicine.  This will allow for the adjustments on your thyroid medicine if needed.  We are going to get breathing tests to evaluate your wheezing more thoroughly.  In the meantime, continue using your [facility].  Make sure you rinse your mouth well after you use the [facility].  We will see you in follow-up in 2 to 3 months time call sooner should any new problems arise.

## 2021-07-24 NOTE — Progress Notes (Signed)
Subjective:    Patient ID: Mallory Fox, female    DOB: Oct 17, 1980, 41 y.o.   MRN: [id number] Patient Care Team: Patient, No Pcp Per (Inactive) as PCP - General (General Practice)  Chief Complaint  Patient presents with   Follow-up    Dry cough at times prod with clear sputum and occ wheezing.     HPI The patient is a 41 year old [place] American woman, lifelong never smoker, who presents for evaluation of chronic cough present since November 2022.  She has been evaluated by Dr. Sandrea Hughs for this issue.  First seen on 27 March 2021 and then subsequently on 08 May 2021.  The patient has been transferred to my care for perceived language barrier.  The patient notes that her cough as noted above started in November 2022 she did not have any acute illness around that time.  The cough is mostly dry and nonproductive at around that time she developed issues with severe gastroesophageal reflux to the point that she notices frank reflux at nighttime with intense backwash of acid that causes burning.  She is sometimes awakened by this.  She notes that she took Protonix for approximately a month, this was prescribed by Dr. Sherene Sires, she noticed that her cough was markedly better while taking the Protonix.  This indicates that her cough is likely triggered by GERD/LPR.  However, she was discouraged in the use of the PPI by her primary care provider due to interaction with levothyroxine.  Since she discontinued the Protonix she has noted worsening of the cough, wheezing and worsening of reflux symptoms.  She sleeps on 2 pillows to try to prevent reflux.  She has not had any fevers, chills or sweats.  Cough has been nonproductive.  No hemoptysis.  No chest pain per se but burning sensation as noted above.  He does not endorse any other symptomatology.  She had IgE performed January which was normal.  CBC with differential did not show any eosinophilia.  Chest x-ray performed at [facility] on 01/05/2021  was normal per report.  She has no occupational exposure.     Review of Systems A 10 point review of systems was performed and it is as noted above otherwise negative.  Past Medical History:  Diagnosis Date   Thyroid disease    Past Surgical History:  Procedure Laterality Date   BREAST BIOPSY Right 2020   benign   CESAREAN SECTION     FRACTURE SURGERY     Patient Active Problem List   Diagnosis Date Noted   Chronic cough 03/27/2021   Bronchitis 02/03/2021   Seasonal allergies 02/03/2021   Family History  Problem Relation Age of Onset   Diabetes Mother    Hypertension Father    Hyperlipidemia Father    Breast cancer Sister 43   Social History   Tobacco Use   Smoking status: Never   Smokeless tobacco: Never  Substance Use Topics   Alcohol use: Yes    Allergies  Allergen Reactions   Sulfa Antibiotics    Current Meds  Medication Sig   albuterol (PROAIR HFA) 108 (90 Base) MCG/ACT inhaler 2 puffs every 4 hours as needed only  if your can't catch your breath   Calcium 200 MG TABS Take by mouth.   famotidine (PEPCID) 20 MG tablet One after supper   levothyroxine (SYNTHROID, LEVOTHROID) 75 MCG tablet Take 75 mcg by mouth daily before breakfast.   mometasone-formoterol (DULERA) 100-5 MCG/ACT AERO Take 2 puffs first thing in  am and then another 2 puffs about 12 hours later.   [DISCONTINUED] pantoprazole (PROTONIX) 40 MG tablet Take 1 tablet (40 mg total) by mouth daily. Take 30-60 min before first meal of the day   [DISCONTINUED] predniSONE (DELTASONE) 10 MG tablet Take  4 each am x 2 days,   2 each am x 2 days,  1 each am x 2 days and stop   Immunization History  Administered Date(s) Administered   PFIZER(Purple Top)SARS-COV-2 Vaccination 05/30/2019, 06/25/2019   Tdap 05/18/2010        Objective:   Physical Exam BP 128/72 (BP Location: Left Arm, Cuff Size: Normal)   Pulse 68   Temp 97.6 F (36.4 C) (Temporal)   Ht 5\' 3"  (1.6 m)   Wt 176 lb 9.6 oz (80.1 kg)    SpO2 100%   BMI 31.28 kg/m  GENERAL: Overweight, well-developed woman, no acute distress, fully ambulatory.  No conversational dyspnea. HEAD: Normocephalic, atraumatic.  EYES: Pupils equal, round, reactive to light.  No scleral icterus.  MOUTH: Nose/mouth/throat not examined due to masking requirements for COVID 19. NECK: Supple. No thyromegaly. Trachea midline. No JVD.  No adenopathy. PULMONARY: Good air entry bilaterally.  No adventitious sounds. CARDIOVASCULAR: S1 and S2. Regular rate and rhythm.  No rubs, murmurs or gallops heard. ABDOMEN: Mild truncal obesity, otherwise benign. MUSCULOSKELETAL: No joint deformity, no clubbing, no edema.  NEUROLOGIC: No overt focal deficit, no gait disturbance, speech is fluent. SKIN: Intact,warm,dry. PSYCH: Mood and behavior normal     Assessment & Plan:     ICD-10-CM   1. Gastroesophageal reflux disease, unspecified whether esophagitis present  K21.9 Ambulatory referral to Gastroenterology   This is actually the main driver of her cough Very symptomatic Resume PPI in the form of omeprazole 40 mg daily Referral to GI    2. Chronic cough  R05.3    Almost completely resolved while taking PPI Has recurred off of PPI GERD/LPR main driver of symptoms    3. Wheezing  R06.2 Pulmonary Function Test [facility] Only   Query reactive airways disease Continue Dulera for now PFTs     Orders Placed This Encounter  Procedures   Ambulatory referral to Gastroenterology    Referral Priority:   Routine    Referral Type:   Consultation    Referral Reason:   Specialty Services Required    Number of Visits Requested:   1   Pulmonary Function Test [facility] Only    Standing Status:   Future    Standing Expiration Date:   07/25/2022    Scheduling Instructions:     Next available.    Order Specific Question:   Full PFT: includes the following: basic spirometry, spirometry pre & post bronchodilator, diffusion capacity (DLCO), lung volumes    Answer:   Full PFT    Meds ordered this encounter  Medications   omeprazole (PRILOSEC) 40 MG capsule    Sig: Take 1 capsule (40 mg total) by mouth daily.    Dispense:  30 capsule    Refill:  3   See the patient in follow-up in 2 to 3 months time she is to contact 07/27/2022 prior to that time should any new difficulties arise.   [place], MD [facility]ch[facility]    *This note was dictated using voice recognition software/Dragon.  Despite best efforts to proofread, errors can occur which can change the meaning. Any transcriptional errors that result from this process are unintentional and may not be fully  corrected at the time of dictation.

## 2021-08-28 ENCOUNTER — Ambulatory Visit: Payer: Managed Care, Other (non HMO) | Attending: Pulmonary Disease

## 2021-08-28 DIAGNOSIS — R062 Wheezing: Secondary | ICD-10-CM | POA: Insufficient documentation

## 2021-08-28 LAB — PULMONARY FUNCTION TEST ARMC ONLY
DL/VA % pred: 108 %
DL/VA: 4.81 ml/min/mmHg/L
DLCO unc % pred: 105 %
DLCO unc: 22.14 ml/min/mmHg
FEF 25-75 Post: 2.91 L/sec
FEF 25-75 Pre: 1.93 L/sec
FEF2575-%Change-Post: 51 %
FEF2575-%Pred-Post: 97 %
FEF2575-%Pred-Pre: 64 %
FEV1-%Change-Post: 15 %
FEV1-%Pred-Post: 90 %
FEV1-%Pred-Pre: 77 %
FEV1-Post: 2.6 L
FEV1-Pre: 2.25 L
FEV1FVC-%Change-Post: 6 %
FEV1FVC-%Pred-Pre: 90 %
FEV6-%Change-Post: 5 %
FEV6-%Pred-Post: 91 %
FEV6-%Pred-Pre: 86 %
FEV6-Post: 3.19 L
FEV6-Pre: 3.02 L
FEV6FVC-%Pred-Post: 101 %
FEV6FVC-%Pred-Pre: 101 %
FVC-%Change-Post: 8 %
FVC-%Pred-Post: 91 %
FVC-%Pred-Pre: 85 %
FVC-Post: 3.26 L
FVC-Pre: 3.02 L
Post FEV1/FVC ratio: 80 %
Post FEV6/FVC ratio: 100 %
Pre FEV1/FVC ratio: 74 %
Pre FEV6/FVC Ratio: 100 %
RV % pred: 70 %
RV: 1.1 L
TLC % pred: 88 %
TLC: 4.33 L

## 2021-08-28 MED ORDER — ALBUTEROL SULFATE (2.5 MG/3ML) 0.083% IN NEBU
2.5000 mg | INHALATION_SOLUTION | Freq: Once | RESPIRATORY_TRACT | Status: AC
  Administered 2021-08-28: 2.5 mg via RESPIRATORY_TRACT
  Filled 2021-08-28: qty 3

## 2021-09-18 ENCOUNTER — Telehealth: Payer: Self-pay | Admitting: Pulmonary Disease

## 2021-09-18 DIAGNOSIS — K219 Gastro-esophageal reflux disease without esophagitis: Secondary | ICD-10-CM

## 2021-09-18 NOTE — Telephone Encounter (Signed)
Per TP verbally--okay to send referral to [facility].  Referral has been placed. Lm for patient.

## 2021-09-18 NOTE — Telephone Encounter (Addendum)
Patient also wanted to make [place] aware that PCP changed Dulera to Symbicort 80mg . Sx have improved some since starting Symbicort.  Med list has been updated.  Nothing further needed.   Routing as an 

## 2021-09-18 NOTE — Telephone Encounter (Signed)
Spoke to patient. She stated that she would like to be referred to [facility], as [facility] can not get her in until October.  She also stated that she stopped taking Prilosec 3 weeks ago due to worsening cough and occ SOB. Cough is still present and productive with white sputum. Denied f/c/s or additional sx.   TP, please advise. Dr. Jayme Cloud is unavailable.

## 2021-09-18 NOTE — Telephone Encounter (Signed)
Patient is aware of below message and voiced her understanding.  Nothing further needed.   

## 2021-09-18 NOTE — Addendum Note (Signed)
Addended by: Lajoyce Lauber A on: 09/18/2021 04:28 PM   Modules accepted: Orders

## 2021-09-21 NOTE — Telephone Encounter (Signed)
Noted, Mallory Fox was not prescribed by me.  Symbicort is more than appropriate.

## 2021-09-21 NOTE — Telephone Encounter (Signed)
Noted.  Will close encounter.  

## 2021-10-14 ENCOUNTER — Telehealth: Payer: Self-pay | Admitting: Pulmonary Disease

## 2021-10-14 NOTE — Telephone Encounter (Signed)
Patient is returning Melissa's call.

## 2021-11-04 ENCOUNTER — Encounter: Payer: Self-pay | Admitting: Pulmonary Disease

## 2021-11-04 ENCOUNTER — Ambulatory Visit (INDEPENDENT_AMBULATORY_CARE_PROVIDER_SITE_OTHER): Payer: Managed Care, Other (non HMO) | Admitting: Pulmonary Disease

## 2021-11-04 VITALS — BP 112/84 | HR 77 | Temp 98.4°F | Ht 63.0 in | Wt 176.0 lb

## 2021-11-04 DIAGNOSIS — J45991 Cough variant asthma: Secondary | ICD-10-CM

## 2021-11-04 DIAGNOSIS — K219 Gastro-esophageal reflux disease without esophagitis: Secondary | ICD-10-CM | POA: Diagnosis not present

## 2021-11-04 MED ORDER — SPIRIVA RESPIMAT 1.25 MCG/ACT IN AERS: 2.0000 | INHALATION_SPRAY | Freq: Every day | RESPIRATORY_TRACT | 0 refills | Status: DC

## 2021-11-04 NOTE — Progress Notes (Signed)
Subjective:    Patient ID: Mallory Fox, female    DOB: 17-Dec-1980, 41 y.o.   MRN: [id number] Patient Care Team: [facility], [facility] as PCP - General (General Practice)  Chief Complaint  Patient presents with   Follow-up   HPI The patient is a 41 year old Latin American woman, lifelong never smoker, who presents for follow-up of chronic cough present since November 2022.  She has been previously evaluated by Dr. Sandrea Hughs for this issue.  First seen on 27 March 2021 and then subsequently on 08 May 2021.  The patient has been transferred to my care for perceived language barrier.  I initially saw her on 24 Jul 2021.  The patient notes that her cough as noted above started in November 2022 she did not have any acute illness around that time.  The cough is mostly dry and nonproductive at around that time she developed issues with severe gastroesophageal reflux to the point that she notices frank reflux at nighttime with intense backwash of acid that causes burning.  She is frequently awakened by this.  She notes that she took Protonix for approximately a month, this was prescribed by Dr. Sherene Sires, she noticed that her cough was markedly better while taking the Protonix.  This indicates that her cough is likely triggered by GERD/LPR.  However, she was discouraged in the use of the PPI by her primary care provider due to interaction with levothyroxine.  Since she discontinued the Protonix she has noted worsening of the cough, wheezing and worsening of reflux symptoms.  She sleeps on 2 pillows to try to prevent reflux.  She has not had any fevers, chills or sweats.  At her prior visit prescribed omeprazole however she developed chest pain and worsening of her cough with the omeprazole.  Since then she has been taking Pepcid but has noted that the cough has not responded as well to this.  She continues to have significant gastroesophageal reflux symptoms.  As noted cough has been  nonproductive.  No hemoptysis.  No chest pain per se but burning sensation as noted above.  He does not endorse any other symptomatology.   She had IgE performed January which was normal.  CBC with differential did not show any eosinophilia.  He is maintained on Symbicort for cough variant asthma which was supported by her PFTs of 28 August 2021.  She apparently has an upcoming appointment with GI.   Review of Systems A 10 point review of systems was performed and it is as noted above otherwise negative.   Patient Active Problem List   Diagnosis Date Noted   Chronic cough 03/27/2021   Bronchitis 02/03/2021   Seasonal allergies 02/03/2021   Social History   Tobacco Use   Smoking status: Never   Smokeless tobacco: Never  Substance Use Topics   Alcohol use: Yes   Allergies  Allergen Reactions   Sulfa Antibiotics    Current Meds  Medication Sig   Calcium 200 MG TABS Take by mouth.   cyclobenzaprine (FLEXERIL) 10 MG tablet Take 1 tablet (10 mg total) by mouth at bedtime.   famotidine (PEPCID) 20 MG tablet One after supper   levothyroxine (SYNTHROID, LEVOTHROID) 75 MCG tablet Take 75 mcg by mouth daily before breakfast.   SYMBICORT 160-4.5 MCG/ACT inhaler Inhale 2 puffs into the lungs 2 (two) times daily.   Immunization History  Administered Date(s) Administered   Influenza,inj,Quad PF,6+ Mos 11/28/2020   PFIZER(Purple Top)SARS-COV-2 Vaccination 05/30/2019, 06/25/2019  Tdap 05/18/2010      Objective:   Physical Exam BP 112/84 (BP Location: Left Arm, Patient Position: Sitting, Cuff Size: Large)   Pulse 77   Temp 98.4 F (36.9 C) (Oral)   Ht 5\' 3"  (1.6 m)   Wt 176 lb (79.8 kg)   SpO2 99%   BMI 31.18 kg/m  GENERAL: Overweight, well-developed woman, no acute distress, fully ambulatory.  No conversational dyspnea. HEAD: Normocephalic, atraumatic.  EYES: Pupils equal, round, reactive to light.  No scleral icterus.  MOUTH: Nose/mouth/throat not examined due to masking  requirements for COVID 19. NECK: Supple. No thyromegaly. Trachea midline. No JVD.  No adenopathy. PULMONARY: Good air entry bilaterally.  No adventitious sounds. CARDIOVASCULAR: S1 and S2. Regular rate and rhythm.  No rubs, murmurs or gallops heard. ABDOMEN: Mild truncal obesity, otherwise benign. MUSCULOSKELETAL: No joint deformity, no clubbing, no edema.  NEUROLOGIC: No overt focal deficit, no gait disturbance, speech is fluent. SKIN: Intact,warm,dry. PSYCH: Mood and behavior normal      Assessment & Plan:     ICD-10-CM   1. 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 Gastroesophageal reflux disease, unspecified whether esophagitis present  K21.9    Has upcoming appointment with GI Had difficulties with omeprazole Currently on Pepcid Still very symptomatic     Meds ordered this encounter  Medications   Tiotropium Bromide Monohydrate (SPIRIVA RESPIMAT) 1.25 MCG/ACT AERS    Sig: Inhale 2 puffs into the lungs daily.    Dispense:  4 g    Refill:  0    Order Specific Question:   Lot Number?    Answer:   c    Order Specific Question:   Expiration Date?    Answer:   04/30/2022    Order Specific Question:   Quantity    Answer:   2   The main driver for her cough appears to be gastroesophageal reflux.  This is very poorly controlled.  She does have an element of asthma we will give her a trial of Spiriva in addition to her Symbicort.  However, suspect that her cough will not be controlled until her reflux is under better control.  She apparently did well with pantoprazole however is concerned about interaction with her levothyroxine and apparently her primary care physician told her to discontinue it.  Omeprazole was tried however she had chest pain and worsening reflux on this medication.  She is currently on Pepcid with poor control of her symptoms.  She has  an upcoming visit with GI.  She is to let 06/30/2022 know how her cough response to the addition of Spiriva.  We will see her in follow-up in 3 months time she is to contact us prior to that time should any new difficulties arise.  [place], MD Advanced Bronchoscopy [facility]    *This note was dictated using voice recognition software/Dragon.  Despite best efforts to proofread, errors can occur which can change the meaning. Any transcriptional errors that result from this process are unintentional and may not be fully corrected at the time of dictation.

## 2021-11-04 NOTE — Progress Notes (Signed)
Patient seen in the office today and instructed on use of spiriva.  Patient expressed understanding and demonstrated technique.  

## 2021-11-04 NOTE — Patient Instructions (Addendum)
I think your cough is due to your very bad gastroesophageal reflux.  You did not tolerate the stomach medication so we will have to wait to see what the gastroenterologist says.  Giving you a trial of an inhaler called Spiriva 1.25 this will be 2 puffs once a day.  Take it at the same time every day.  Take it for at least a couple of weeks I want to see if this helps your cough.  If it does not you can stop it.  If it does help give [place] a call so that we can call the prescription in for you.  We will see you in follow-up in 3 months time call sooner should any new problems arise.    Creo que tu tos se debe a tu muy fuerte reflujo gastroesofgico.  No toler la medicacin para el estmago por lo que habr que esperar a Stage manager.  Le ofrecemos una prueba de un inhalador llamado Spiriva 1.25, que sern 2 inhalaciones una vez al [employer]. Tmelo a la [employer]. Tmalo durante al [employer] un par de Johnson & Johnson. Quiero ver si esto te ayuda con la tos. Si no es as, puedes detenerlo. Si le ayuda, llmenos para que podamos solicitar la receta por usted.  Nos veremos en el seguimiento dentro de 3 meses pero llamanos antes si surge algn problema nuevo.

## 2021-11-06 ENCOUNTER — Ambulatory Visit: Payer: Managed Care, Other (non HMO) | Admitting: Pulmonary Disease

## 2021-12-03 ENCOUNTER — Encounter: Payer: Self-pay | Admitting: Pulmonary Disease

## 2021-12-07 ENCOUNTER — Telehealth: Payer: Self-pay

## 2021-12-07 NOTE — Telephone Encounter (Signed)
Called patient back to reschedule her appointment with Dr. Vicente Males since he will be on vacation on 12/17/2021. However, I had to leave her a message to return our call to reschedule her.

## 2021-12-10 NOTE — Telephone Encounter (Signed)
Patient called back and she rescheduled for 01/28/2022 at 3:30 PM.

## 2021-12-17 ENCOUNTER — Ambulatory Visit: Payer: Managed Care, Other (non HMO) | Admitting: Gastroenterology

## 2021-12-18 ENCOUNTER — Telehealth: Payer: Self-pay

## 2021-12-18 NOTE — Telephone Encounter (Signed)
Returned to call to patient who was requesting LCS LDCT.  She believes she was referred to [place].  No referral noted but explained to patient that for LCS LDCT, she would need to be age 41 years and also have a smoking history. Patient has never smoked so she would not be eligible.  She states she has problems with a cough.  I explained her pulmonologist or PCP could order a CT chest as a diagnostic scan, if they recommended one.  She states she would discuss with Dr. Patsey Berthold since she is not eligible for LCS.

## 2022-01-27 ENCOUNTER — Other Ambulatory Visit: Payer: Self-pay

## 2022-01-28 ENCOUNTER — Other Ambulatory Visit: Payer: Self-pay

## 2022-01-28 ENCOUNTER — Encounter: Payer: Self-pay | Admitting: Gastroenterology

## 2022-01-28 ENCOUNTER — Ambulatory Visit: Payer: Managed Care, Other (non HMO) | Admitting: Gastroenterology

## 2022-01-28 VITALS — BP 119/74 | HR 78 | Temp 99.4°F | Ht 63.0 in | Wt 177.4 lb

## 2022-01-28 DIAGNOSIS — K219 Gastro-esophageal reflux disease without esophagitis: Secondary | ICD-10-CM

## 2022-01-28 NOTE — Patient Instructions (Signed)
Opciones de alimentos para pacientes adultos con enfermedad de reflujo gastroesofgico Food Choices for Gastroesophageal Reflux Disease, Adult Si tiene enfermedad de reflujo gastroesofgico (ERGE), los alimentos que consume y los hbitos de alimentacin son muy importantes. Elegir los alimentos adecuados puede ayudar a [employer]. Piense en consultar a un experto en alimentacin (nutricionista) para que lo ayude a Warehouse manager. Consejos para seguir Photographer las etiquetas de los alimentos Elija alimentos que tengan bajo contenido de grasas saturadas. Los alimentos que pueden ayudar con los sntomas incluyen los siguientes: Alimentos que tienen menos del 5 % de los valores diarios (VD) de grasa. Alimentos que tienen 0 gramos de grasas trans. Al cocinar No frer los alimentos. Cocinar la comida al horno, al vapor, a la plancha o en la parrilla. Estos son mtodos que no necesitan mucha grasa para Audiological scientist. Para agregar sabor, trate de consumir hierbas con bajo contenido de picante y [place]. Planificacin de las comidas  Elegir alimentos saludables con bajo contenido de [place], por ejemplo: Frutas y vegetales. Cereales integrales. Productos lcteos con bajo contenido de Lilly. Lysbeth Galas, pescado y aves. Haga comidas pequeas frecuentes en lugar de 3 comidas abundantes al da. Coma lentamente, en un lugar donde est relajado. Evite agacharse o recostarse hasta 2 o 3 horas despus de haber comido. Limite los alimentos con alto contenido graso como las carnes grasas o los alimentos fritos. Limite su ingesta de alimentos grasos, como aceites, [place] y [place]. Evite lo siguiente si el mdico se lo indica: Consumir alimentos que le ocasionen sntomas. Pueden ser distintos para cada persona. Lleve un registro de los alimentos para identificar aquellos que le causen sntomas. Alcohol. Beber mucha cantidad de lquido con las comidas. Comer 2 o 3 horas antes de  acostarse. Estilo de vida Mantenga un peso saludable. Pregntele a su mdico cul es el peso saludable para usted. Si necesita perder peso, hable con su mdico para hacerlo de manera segura. Haga actividad fsica durante, al menos, 30 minutos 5 das por semana o ms, o como se lo haya indicado el mdico. Use ropa suelta. No fume ni consuma ningn producto que contenga nicotina o tabaco. Si necesita ayuda para dejar de fumar, consulte al mdico. Duerma con la cabecera de la cama ms elevada que los pies. Use una cua debajo del colchn o bloques debajo del armazn de la cama para Theatre manager la cabecera de la cama elevada. Mastique chicle sin azcar despus de las comidas. Qu alimentos debo comer?  Siga una dieta saludable y bien equilibrada que incluya abundantes frutas, verduras, cereales integrales, productos lcteos descremados, carnes magras, pescado y aves. Cada persona es diferente. Los alimentos que pueden causar sntomas en una persona pueden no causar sntomas en otra persona. Trabaje con el mdico para hallar alimentos que sean seguros para usted. Es posible que los productos que se enumeran ms [place] no constituyan una lista completa de lo que puede comer y Electronics engineer. Pngase en contacto con un experto en alimentos para conocer ms opciones. Qu alimentos debo evitar? Limitar algunos de estos alimentos puede ayudar a Chief Technology Officer los sntomas de [place]. Cada persona es diferente. Consulte a un experto en alimentacin o a su mdico para que lo ayude a Pension scheme manager los alimentos exactos que debe evitar, si los hubiere. Frutas Cualquier fruta que est preparada con grasa agregada. Cualquier fruta que le ocasione sntomas. Para algunas personas, estas pueden incluir, las frutas ctricas como naranja, pomelo, pia y limn. Verduras Verduras fritas en abundante aceite. Papas fritas.  Cualquier verdura que est preparada con grasa agregada. Cualquier verdura que le ocasione sntomas. Para algunas personas,  estas pueden incluir tomates y productos con tomate, Spur, cebollas y [place], y rbanos picantes. Granos Pasteles o panes sin levadura con grasa agregada. Carnes y [address] protenas [address] de alto contenido graso como carne grasa de vaca o cerdo, salchichas, costillas, jamn, salchicha, salame y tocino. Carnes o protenas fritas, lo que incluye pescado frito y pollo frito. Frutos secos y Civil engineer, contracting de frutos secos, en grandes cantidades. Lcteos Leche entera y Western con chocolate. Tami Ribas. Crema. Helado. Queso crema. Batidos con [employer]. Grasas y [employer]. Margarina. Lardo. Mantequilla clarificada. Bebidas Caf y t negro, con o sin cafena. Bebidas con gas. Refrescos. Bebidas energizantes. Jugo de fruta hecho con frutas cidas, como naranja o pomelo. Jugo de tomate. Bebidas alcohlicas. Dulces y postres Chocolate y cacao. Rosquillas. Alios y condimentos Pimienta. Menta y mentol. Sal agregada. Cualquier condimento, hierbas o aderezos que le ocasionen sntomas. Para algunas personas, esto puede incluir curry, salsa picante o aderezos para ensalada a base de vinagre. Es posible que los productos que se enumeran ms arriba no constituyan una lista completa de lo que no debera comer y Product manager. Pngase en contacto con un experto en alimentos para conocer ms opciones. Preguntas para hacerle al [employer] Los cambios en la dieta y en el estilo de vida a menudo son los primeros pasos que se toman para Company secretary los sntomas de [place]. Si los [employer] dieta y el estilo de vida no ayudan, hable con el mdico sobre el uso de medicamentos. Dnde buscar ms informacin Arts development officer for Gastrointestinal Disorders (Fundacin Internacional para los Trastornos Gastrointestinales): aboutgerd.org Resumen Si tiene [employer], las elecciones de alimentos y el [place] de vida son muy importantes para ayudar a Consulting civil engineer sntomas. Haga comidas pequeas durante Glass blower/designer de 3 comidas abundantes.  Coma lentamente y en un lugar donde est relajado. Evite agacharse o recostarse hasta 2 o 3 horas despus de haber comido. Limite los alimentos con alto contenido graso como las carnes grasas o los alimentos fritos. Esta informacin no tiene Theme park manager el consejo del mdico. Asegrese de hacerle al mdico cualquier pregunta que tenga. Document Revised: 09/28/2019 Document Reviewed: 09/28/2019 Elsevier Patient Education  2023 [employer].

## 2022-01-28 NOTE — Progress Notes (Signed)
Mallory Mood MD, MRCP(U.K) [address]  [address]  [place], [place] [address]  Main: [phone]  Fax: [phone]   Gastroenterology Consultation  Referring Provider:   Dr. Jayme Cloud Primary Care Physician:  [facility], [facility] Primary Gastroenterologist:  Dr. Wyline Fox  Reason for Consultation:     GERD        HPI:   Mallory Fox is a 41 y.o. y/o female referred for GERD  Seen by Dr. Jayme Cloud in May 2023 and felt that her cough was likely secondary to long-term acid reflux and was commenced on a PPI.  She says that after the visit with Dr. Jayme Cloud she was prescribed a PPI which definitely helped her with her cough but she says that subsequently she did not tolerate the PPI and stopped it because she said she had symptoms of regurgitation.  She since not started the PPI but says her cough is much better after starting multiple other inhalers.  She says that she would like to know the cause of her reflux and see if there is any damage.  Past Medical History:  Diagnosis Date   Thyroid disease     Past Surgical History:  Procedure Laterality Date   BREAST BIOPSY Right 2020   benign   CESAREAN SECTION     FRACTURE SURGERY      Prior to Admission medications   Medication Sig Start Date End Date Taking? Authorizing Provider  albuterol (VENTOLIN HFA) 108 (90 Base) MCG/ACT inhaler Inhale 2 puffs into the lungs every 4 (four) hours as needed. 12/04/21  Yes [provider]  famotidine (PEPCID) 20 MG tablet One after supper 05/08/21  Yes Nyoka Cowden, MD  levothyroxine (SYNTHROID, LEVOTHROID) 75 MCG tablet Take 75 mcg by mouth daily before breakfast.   Yes [provider]  Multiple Vitamin (MULTIVITAMIN) capsule Take 1 capsule by mouth daily.   Yes [provider]  SYMBICORT 160-4.5 MCG/ACT inhaler Inhale 2 puffs into the lungs 2 (two) times daily. 10/16/21  Yes [provider]  Tiotropium Bromide Monohydrate  (SPIRIVA RESPIMAT) 1.25 MCG/ACT AERS Inhale 2 puffs into the lungs daily. 11/04/21  Yes Salena Saner, MD  hydrOXYzine (ATARAX) 25 MG tablet Take 25-50 mg by mouth every 8 (eight) hours as needed. Patient not taking: Reported on 01/28/2022 07/17/21   [provider]    Family History  Problem Relation Age of Onset   Diabetes Mother    Hypertension Father    Hyperlipidemia Father    Breast cancer Sister 25     Social History   Tobacco Use   Smoking status: Never   Smokeless tobacco: Never  Vaping Use   Vaping Use: Never used  Substance Use Topics   Alcohol use: Yes   Drug use: Never    Allergies as of 01/28/2022 - Review Complete 01/28/2022  Allergen Reaction Noted   Sulfa antibiotics  02/20/2018    Review of Systems:    All systems reviewed and negative except where noted in HPI.   Physical Exam:  BP 119/74   Pulse 78   Temp 99.4 F (37.4 C) (Oral)   Ht 5\' 3"  (1.6 m)   Wt 177 lb 6.4 oz (80.5 kg)   BMI 31.42 kg/m  No LMP recorded. Psych:  Alert and cooperative. Normal Fox and affect. General:   Alert,  Well-developed, well-nourished, pleasant and cooperative in NAD Head:  Normocephalic and atraumatic. Eyes:  Sclera clear, no icterus.   Conjunctiva  pink. Ears:  Normal auditory acuity.    Neurologic:  Alert and oriented x3;  grossly normal neurologically. Psych:  Alert and cooperative. Normal Fox and affect.  Imaging Studies: No results found.  Assessment and Plan:   Mallory Fox is a 41 y.o. y/o female has been referred for GERD.  Seen by Dr. Jayme Cloud in May 2023 and it was determined it is possible that her chronic cough is secondary to reflux.  She responded very well to omeprazole in terms of improvement with her cough but subsequently she stopped it on her own due to regurgitation which she blames as a side effect of the medication.  The fact that her symptoms improved on a PPI supports the diagnosis of GERD.  We talked about lifestyle  changes including not eating for 2 to 3 hours before bedtime, using a wedge pillow and most important losing some weight.  She states she has gained weight recently.  I explained to her we can proceed with an upper endoscopy to rule out a large hiatal hernia and if present the options would be medical management in addition to lifestyle changes versus surgical correction.  She says she was not very interested in a surgery but explained to her that with the last option.  We will provide her information and I will set her up for an upper endoscopy and we will see her back in 3 months   I have discussed alternative options, risks & benefits,  which include, but are not limited to, bleeding, infection, perforation,respiratory complication & drug reaction.  The patient agrees with this plan & written consent will be obtained.     Follow up in 3 months   Dr Mallory Mood MD,MRCP(U.K)

## 2022-02-05 ENCOUNTER — Encounter: Payer: Self-pay | Admitting: Pulmonary Disease

## 2022-02-05 ENCOUNTER — Ambulatory Visit (INDEPENDENT_AMBULATORY_CARE_PROVIDER_SITE_OTHER): Payer: Managed Care, Other (non HMO) | Admitting: Pulmonary Disease

## 2022-02-05 VITALS — BP 124/80 | HR 83 | Temp 98.6°F | Ht 63.0 in | Wt 174.8 lb

## 2022-02-05 DIAGNOSIS — J45991 Cough variant asthma: Secondary | ICD-10-CM | POA: Diagnosis not present

## 2022-02-05 DIAGNOSIS — K219 Gastro-esophageal reflux disease without esophagitis: Secondary | ICD-10-CM

## 2022-02-05 LAB — NITRIC OXIDE: Nitric Oxide: 17

## 2022-02-05 MED ORDER — ALBUTEROL SULFATE HFA 108 (90 BASE) MCG/ACT IN AERS: 2.0000 | INHALATION_SPRAY | RESPIRATORY_TRACT | 3 refills | Status: AC | PRN

## 2022-02-05 MED ORDER — SYMBICORT 160-4.5 MCG/ACT IN AERO: 2.0000 | INHALATION_SPRAY | Freq: Two times a day (BID) | RESPIRATORY_TRACT | 11 refills | Status: AC

## 2022-02-05 NOTE — Progress Notes (Signed)
Subjective:    Patient ID: Mallory Fox, female    DOB: 09-Apr-1980, 41 y.o.   MRN: [id number] Patient Care Team: [facility], [facility] as PCP - General (General Practice) Salena Saner, MD as Consulting Physician (Pulmonary Disease)  Chief Complaint  Patient presents with   Follow-up    Much Better. A little SOB with exertion. A little wheezing and cough.    HPI Patient is a 41 year old Latin American woman lifelong never smoker, who presents for follow-up of chronic cough initially noted November 2022.  I last saw the patient on 04 November 2021 she has been diagnosed with cough variant asthma.  Trigger appears to be gastroesophageal reflux.  He has had no evidence of eosinophilia or elevated IgE.  She states that she is scheduled to have endoscopy 15 December this is an upper endoscopy.  I think this is reasonable given that she has had several issues with PPIs and Pepcid does not control her symptoms completely.  She had however noted improvement in her cough with PPI use.  Since her initial visit with me on 6 September she has been doing markedly better at that time we had added Spiriva for control of her cough since then she has been able to discontinue that and continue Symbicort.  She has been on Symbicort 160/4.5, 2 puffs twice a day and as needed albuterol.  She has only had to use albuterol once.  Her cough is markedly improved only occurs occasionally, it is dry no hemoptysis.  She does not endorse any other symptomatology today.   Review of Systems A 10 point review of systems was performed and it is as noted above otherwise negative.  Patient Active Problem List   Diagnosis Date Noted   Chronic cough 03/27/2021   Bronchitis 02/03/2021   Seasonal allergies 02/03/2021   Social History   Tobacco Use   Smoking status: Never   Smokeless tobacco: Never  Substance Use Topics   Alcohol use: Yes   Allergies  Allergen Reactions   Sulfa  Antibiotics    Current Meds  Medication Sig   albuterol (VENTOLIN HFA) 108 (90 Base) MCG/ACT inhaler Inhale 2 puffs into the lungs every 4 (four) hours as needed.   famotidine (PEPCID) 20 MG tablet One after supper   hydrOXYzine (ATARAX) 25 MG tablet Take 25-50 mg by mouth every 8 (eight) hours as needed.   levothyroxine (SYNTHROID, LEVOTHROID) 75 MCG tablet Take 75 mcg by mouth daily before breakfast.   Multiple Vitamin (MULTIVITAMIN) capsule Take 1 capsule by mouth daily.   SYMBICORT 160-4.5 MCG/ACT inhaler Inhale 2 puffs into the lungs 2 (two) times daily.   Immunization History  Administered Date(s) Administered   Influenza,inj,Quad PF,6+ Mos 11/28/2020   Influenza-Unspecified 11/29/2021   PFIZER(Purple Top)SARS-COV-2 Vaccination 05/30/2019, 06/25/2019   Tdap 05/18/2010      Objective:   Physical Exam BP 124/80 (BP Location: Left Arm, Cuff Size: Normal)   Pulse 83   Temp 98.6 F (37 C)   Ht 5\' 3"  (1.6 m)   Wt 174 lb 12.8 oz (79.3 kg)   SpO2 97%   BMI 30.96 kg/m  GENERAL: Overweight, well-developed woman, no acute distress, fully ambulatory.  No conversational dyspnea. HEAD: Normocephalic, atraumatic.  EYES: Pupils equal, round, reactive to light.  No scleral icterus.  MOUTH: Nose/mouth/throat not examined due to masking requirements for COVID 19. NECK: Supple. No thyromegaly. Trachea midline. No JVD.  No adenopathy. PULMONARY: Good air entry bilaterally.  No adventitious  sounds. CARDIOVASCULAR: S1 and S2. Regular rate and rhythm.  No rubs, murmurs or gallops heard. ABDOMEN: Mild truncal obesity, otherwise benign. MUSCULOSKELETAL: No joint deformity, no clubbing, no edema.  NEUROLOGIC: No overt focal deficit, no gait disturbance, speech is fluent. SKIN: Intact,warm,dry. PSYCH: Mood and behavior normal    Lab Results  Component Value Date   NITRICOXIDE 17 02/05/2022  No evidence of type II inflammation.     Assessment & Plan:     ICD-10-CM   1. Cough variant  asthma  J45.991 Nitric oxide   Well-controlled on current regimen Continue Symbicort and albuterol (as needed) Follow-up 6 months or as needed    2. Gastroesophageal reflux disease, unspecified whether esophagitis present  K21.9    To have EGD 15 December This issue adds complexity to her management     Orders Placed This Encounter  Procedures   Nitric oxide   Meds ordered this encounter  Medications   albuterol (VENTOLIN HFA) 108 (90 Base) MCG/ACT inhaler    Sig: Inhale 2 puffs into the lungs every 4 (four) hours as needed.    Dispense:  8 g    Refill:  3   SYMBICORT 160-4.5 MCG/ACT inhaler    Sig: Inhale 2 puffs into the lungs 2 (two) times daily.    Dispense:  10.2 g    Refill:  11   Patient will continue with the same regimen.  Will see her in follow-up in 6 months time she is to contact us prior to that time should any new difficulties arise.  Gailen Shelter, MD Advanced Bronchoscopy PCCM  [facility]    *This note was dictated using voice recognition software/Dragon.  Despite best efforts to proofread, errors can occur which can change the meaning. Any transcriptional errors that result from this process are unintentional and may not be fully corrected at the time of dictation.

## 2022-02-05 NOTE — Patient Instructions (Addendum)
Continue taking your Symbicort 2 puffs twice a day  Continue taking the albuterol as needed.  We will see you in follow-up in 6 months time however, call sooner should any new breathing issues arise.    Contine tomando Symbicort 2 inhalaciones dos veces al da.  Contine tomando albuterol segn sea necesario.  Nos veremos en seguimiento dentro de 6 meses; sin embargo, llame antes si surge algn nuevo problema respiratorio.

## 2022-02-12 ENCOUNTER — Encounter: Payer: Self-pay | Admitting: Gastroenterology

## 2022-02-12 ENCOUNTER — Ambulatory Visit: Payer: Managed Care, Other (non HMO) | Admitting: Anesthesiology

## 2022-02-12 ENCOUNTER — Ambulatory Visit
Admission: RE | Admit: 2022-02-12 | Discharge: 2022-02-12 | Disposition: A | Discharge status: Home / Self Care | Payer: Managed Care, Other (non HMO) | Attending: Gastroenterology | Admitting: Gastroenterology

## 2022-02-12 ENCOUNTER — Encounter
Admission: RE | Disposition: A | Discharge status: Home / Self Care | Payer: Self-pay | Source: Home / Self Care | Attending: Gastroenterology

## 2022-02-12 DIAGNOSIS — K219 Gastro-esophageal reflux disease without esophagitis: Secondary | ICD-10-CM | POA: Diagnosis present

## 2022-02-12 DIAGNOSIS — E079 Disorder of thyroid, unspecified: Secondary | ICD-10-CM | POA: Insufficient documentation

## 2022-02-12 HISTORY — PX: ESOPHAGOGASTRODUODENOSCOPY (EGD) WITH PROPOFOL: SHX5813

## 2022-02-12 LAB — PREGNANCY, URINE: Preg Test, Ur: NEGATIVE

## 2022-02-12 SURGERY — ESOPHAGOGASTRODUODENOSCOPY (EGD) WITH PROPOFOL
Anesthesia: General

## 2022-02-12 MED ORDER — ONDANSETRON HCL 4 MG/2ML IJ SOLN
INTRAMUSCULAR | Status: AC
  Filled 2022-02-12: qty 2

## 2022-02-12 MED ORDER — DEXMEDETOMIDINE HCL 200 MCG/2ML IV SOLN
INTRAVENOUS | Status: DC | PRN
  Administered 2022-02-12: 12 ug via INTRAVENOUS

## 2022-02-12 MED ORDER — PROPOFOL 500 MG/50ML IV EMUL
INTRAVENOUS | Status: DC | PRN
  Administered 2022-02-12: 221.239 ug/kg/min via INTRAVENOUS

## 2022-02-12 MED ORDER — GLYCOPYRROLATE 0.2 MG/ML IJ SOLN
INTRAMUSCULAR | Status: AC
  Filled 2022-02-12: qty 1

## 2022-02-12 MED ORDER — GLYCOPYRROLATE 0.2 MG/ML IJ SOLN
INTRAMUSCULAR | Status: DC | PRN
  Administered 2022-02-12: .2 mg via INTRAVENOUS

## 2022-02-12 MED ORDER — PROPOFOL 10 MG/ML IV BOLUS
INTRAVENOUS | Status: DC | PRN
  Administered 2022-02-12: 90 mg via INTRAVENOUS

## 2022-02-12 MED ORDER — SODIUM CHLORIDE 0.9 % IV SOLN
INTRAVENOUS | Status: DC
  Administered 2022-02-12: 1000 mL via INTRAVENOUS

## 2022-02-12 MED ORDER — LIDOCAINE HCL (CARDIAC) PF 100 MG/5ML IV SOSY
PREFILLED_SYRINGE | INTRAVENOUS | Status: DC | PRN
  Administered 2022-02-12: 100 mg via INTRAVENOUS

## 2022-02-12 NOTE — Op Note (Signed)
[facility] Gastroenterology Patient Name: Mallory Fox Procedure Date: 02/12/2022 9:56 AM MRN: [id number] Account #: [id number] Date of Birth: 12-29-1980 Admit Type: Outpatient Age: 41 Room: [facility] ENDO ROOM 4 Gender: Female Note Status: Finalized Instrument Name: Upper Endoscope [id number] Procedure:             Upper GI endoscopy Indications:           Follow-up of esophageal reflux Providers:             Wyline Mood MD, MD Referring MD:          De Blanch, MD (Referring MD) Medicines:             Monitored Anesthesia Care Complications:         No immediate complications. Procedure:             Pre-Anesthesia Assessment:                        - Prior to the procedure, a History and Physical was                         performed, and patient medications, allergies and                         sensitivities were reviewed. The patient's tolerance                         of previous anesthesia was reviewed.                        - The risks and benefits of the procedure and the                         sedation options and risks were discussed with the                         patient. All questions were answered and informed                         consent was obtained.                        - ASA Grade Assessment: II - A patient with mild                         systemic disease.                        After obtaining informed consent, the endoscope was                         passed under direct vision. Throughout the procedure,                         the patient's blood pressure, pulse, and oxygen                         saturations were monitored continuously. The Endoscope  was introduced through the mouth, and advanced to the                         third part of duodenum. The upper GI endoscopy was                         accomplished with ease. The patient tolerated the                         procedure  well. Findings:      The esophagus was normal.      The stomach was normal.      The examined duodenum was normal. Impression:            - Normal esophagus.                        - Normal stomach.                        - Normal examined duodenum.                        - No specimens collected. Recommendation:        - Discharge patient to home (ambulatory).                        - Resume previous diet.                        - Continue present medications.                        - Return to my office as previously scheduled. Procedure Code(s):     --- Professional ---                        [phone], Esophagogastroduodenoscopy, flexible,                         transoral; diagnostic, including collection of                         specimen(s) by brushing or washing, when performed                         (separate procedure) Diagnosis Code(s):     --- Professional ---                        K21.9, Gastro-esophageal reflux disease without                         esophagitis CPT copyright 2022 American Medical Association. All rights reserved. The codes documented in this report are preliminary and upon coder review may  be revised to meet current compliance requirements. Wyline Mood, MD Wyline Mood MD, MD 02/12/2022 10:16:58 AM This report has been signed electronically. Number of Addenda: 0 Note Initiated On: 02/12/2022 9:56 AM Estimated Blood Loss:  Estimated blood loss: none.      [facility]

## 2022-02-12 NOTE — Transfer of Care (Signed)
Immediate Anesthesia Transfer of Care Note  Patient: Mallory Fox  Procedure(s) Performed: ESOPHAGOGASTRODUODENOSCOPY (EGD) WITH PROPOFOL  Patient Location: Endoscopy Unit  Anesthesia Type:General  Level of Consciousness: drowsy  Airway & Oxygen Therapy: Patient Spontanous Breathing  Post-op Assessment: Report given to RN and Post -op Vital signs reviewed and stable  Post vital signs: Reviewed and stable  Last Vitals:  Vitals Value Taken Time  BP 97/65 02/12/22 1020  Temp    Pulse 79 02/12/22 1020  Resp 15 02/12/22 1020  SpO2 95 % 02/12/22 1020  Vitals shown include unvalidated device data.  Last Pain:  Vitals:   02/12/22 0948  PainSc: 0-No pain         Complications: No notable events documented.

## 2022-02-12 NOTE — Anesthesia Preprocedure Evaluation (Signed)
Anesthesia Evaluation  Patient identified by MRN, date of birth, ID band Patient awake    Reviewed: Allergy & Precautions, NPO status , Patient's Chart, lab work & pertinent test results  History of Anesthesia Complications Negative for: history of anesthetic complications  Airway Mallampati: III  TM Distance: >3 FB Neck ROM: full    Dental  (+) Chipped   Pulmonary neg pulmonary ROS, neg shortness of breath   Pulmonary exam normal        Cardiovascular Exercise Tolerance: Good (-) angina negative cardio ROS Normal cardiovascular exam     Neuro/Psych negative neurological ROS  negative psych ROS   GI/Hepatic negative GI ROS, Neg liver ROS,neg GERD  ,,  Endo/Other  negative endocrine ROS    Renal/GU negative Renal ROS  negative genitourinary   Musculoskeletal   Abdominal   Peds  Hematology negative hematology ROS (+)   Anesthesia Other Findings Past Medical History: No date: Thyroid disease  Past Surgical History: 2020: BREAST BIOPSY; Right     Comment:  benign No date: CESAREAN SECTION No date: FRACTURE SURGERY  BMI    Body Mass Index: 30.89 kg/m      Reproductive/Obstetrics negative OB ROS                             Anesthesia Physical Anesthesia Plan  ASA: 2  Anesthesia Plan: General   Post-op Pain Management:    Induction: Intravenous  PONV Risk Score and Plan: Propofol infusion and TIVA  Airway Management Planned: Natural Airway and Nasal Cannula  Additional Equipment:   Intra-op Plan:   Post-operative Plan:   Informed Consent: I have reviewed the patients History and Physical, chart, labs and discussed the procedure including the risks, benefits and alternatives for the proposed anesthesia with the patient or authorized representative who has indicated his/her understanding and acceptance.     Dental Advisory Given  Plan Discussed with:  Anesthesiologist, CRNA and Surgeon  Anesthesia Plan Comments: (Patient declines interpreter   Patient consented for risks of anesthesia including but not limited to:  - adverse reactions to medications - risk of airway placement if required - damage to eyes, teeth, lips or other oral mucosa - nerve damage due to positioning  - sore throat or hoarseness - Damage to heart, brain, nerves, lungs, other parts of body or loss of life  Patient voiced understanding.)       Anesthesia Quick Evaluation

## 2022-02-12 NOTE — Anesthesia Postprocedure Evaluation (Signed)
Anesthesia Post Note  Patient: Story Conti  Procedure(s) Performed: ESOPHAGOGASTRODUODENOSCOPY (EGD) WITH PROPOFOL  Patient location during evaluation: Endoscopy Anesthesia Type: General Level of consciousness: awake and alert Pain management: pain level controlled Vital Signs Assessment: post-procedure vital signs reviewed and stable Respiratory status: spontaneous breathing, nonlabored ventilation, respiratory function stable and patient connected to nasal cannula oxygen Cardiovascular status: blood pressure returned to baseline and stable Postop Assessment: no apparent nausea or vomiting Anesthetic complications: no   No notable events documented.   Last Vitals:  Vitals:   02/12/22 1031 02/12/22 1039  BP: 102/66 107/70  Pulse: 72 72  Resp: 14 12  Temp:    SpO2: 97% 97%    Last Pain:  Vitals:   02/12/22 1020  TempSrc: Temporal  PainSc:                  Cleda Mccreedy Omer Puccinelli

## 2022-02-12 NOTE — H&P (Signed)
Wyline Mood, MD [address], [address], [place], [place], [address] [address], [place], [place], [address] Phone: [phone]  Fax: [phone]  Primary Care Physician:  Unice Bailey, MD   Pre-Procedure History & Physical: HPI:  Mallory Fox is a 41 y.o. female is here for an endoscopy    Past Medical History:  Diagnosis Date   Thyroid disease     Past Surgical History:  Procedure Laterality Date   BREAST BIOPSY Right 2020   benign   CESAREAN SECTION     FRACTURE SURGERY      Prior to Admission medications   Medication Sig Start Date End Date Taking? Authorizing Provider  levothyroxine (SYNTHROID, LEVOTHROID) 75 MCG tablet Take 75 mcg by mouth daily before breakfast.   Yes [provider]  Multiple Vitamin (MULTIVITAMIN) capsule Take 1 capsule by mouth daily.   Yes [provider]  SYMBICORT 160-4.5 MCG/ACT inhaler Inhale 2 puffs into the lungs 2 (two) times daily. 02/05/22  Yes Salena Saner, MD  albuterol (VENTOLIN HFA) 108 (90 Base) MCG/ACT inhaler Inhale 2 puffs into the lungs every 4 (four) hours as needed. 02/05/22   Salena Saner, MD  famotidine (PEPCID) 20 MG tablet One after supper Patient not taking: Reported on 02/12/2022 05/08/21   Nyoka Cowden, MD  hydrOXYzine (ATARAX) 25 MG tablet Take 25-50 mg by mouth every 8 (eight) hours as needed. 07/17/21   [provider]  Tiotropium Bromide Monohydrate (SPIRIVA RESPIMAT) 1.25 MCG/ACT AERS Inhale 2 puffs into the lungs daily. Patient not taking: Reported on 02/05/2022 11/04/21   Salena Saner, MD    Allergies as of 01/29/2022 - Review Complete 01/28/2022  Allergen Reaction Noted   Sulfa antibiotics  02/20/2018    Family History  Problem Relation Age of Onset   Diabetes Mother    Hypertension Father    Hyperlipidemia Father    Breast cancer Sister 19    Social History   Socioeconomic History   Marital status: Married    Spouse name:  Not on file   Number of children: Not on file   Years of education: Not on file   Highest education level: Not on file  Occupational History   Not on file  Tobacco Use   Smoking status: Never   Smokeless tobacco: Never  Vaping Use   Vaping Use: Never used  Substance and Sexual Activity   Alcohol use: Yes   Drug use: Never   Sexual activity: Not on file  Other Topics Concern   Not on file  Social History Narrative   Not on file   Social Determinants of Health   Financial Resource Strain: Not on file  Food Insecurity: Not on file  Transportation Needs: Not on file  Physical Activity: Not on file  Stress: Not on file  Social Connections: Not on file  Intimate Partner Violence: Not on file    Review of Systems: See HPI, otherwise negative ROS  Physical Exam: BP 118/79   Pulse 78   Temp (!) 96.9 F (36.1 C)   Resp 18   Ht 5\' 3"  (1.6 m)   Wt 79.1 kg   LMP 01/27/2022   SpO2 100%   BMI 30.89 kg/m  General:   Alert,  pleasant and cooperative in NAD Head:  Normocephalic and atraumatic. Neck:  Supple; no masses or thyromegaly. Lungs:  Clear throughout to auscultation, normal respiratory effort.    Heart:  +S1, +S2, Regular  rate and rhythm, No edema. Abdomen:  Soft, nontender and nondistended. Normal bowel sounds, without guarding, and without rebound.   Neurologic:  Alert and  oriented x4;  grossly normal neurologically.  Impression/Plan: Mallory Fox is here for an endoscopy  to be performed for  evaluation of GERD    Risks, benefits, limitations, and alternatives regarding endoscopy have been reviewed with the patient.  Questions have been answered.  All parties agreeable.   Wyline Mood, MD  02/12/2022, 10:03 AM

## 2022-02-15 ENCOUNTER — Encounter: Payer: Self-pay | Admitting: Gastroenterology

## 2022-04-29 ENCOUNTER — Ambulatory Visit: Payer: Managed Care, Other (non HMO) | Admitting: Gastroenterology

## 2022-04-29 ENCOUNTER — Encounter: Payer: Self-pay | Admitting: Gastroenterology

## 2022-04-29 VITALS — BP 113/74 | HR 83 | Temp 98.1°F | Wt 179.6 lb

## 2022-04-29 DIAGNOSIS — K219 Gastro-esophageal reflux disease without esophagitis: Secondary | ICD-10-CM

## 2022-04-29 NOTE — Progress Notes (Signed)
    Jonathon Bellows MD, MRCP(U.K) [address]  [place]  [place], [place] [address]  Main: [phone]  Fax: [phone]   Primary Care Physician: Janna Arch, MD  Primary Gastroenterologist:  Dr. Jonathon Bellows   Chief Complaint  Patient presents with   Gastroesophageal Reflux    HPI: Mallory Fox is a 42 y.o. female   Summary of history :  She was initially referred and seen in November 2023 after being referred by Dr. Patsey Berthold for cough likely secondary to long-term acid reflux and was commenced on a PPI she previously had to stop the PPI as she had symptoms of regurgitation.  She subsequently stopped the PPI and her cough is much better after starting multiple inhalers  Interval history   01/28/2022-04/29/2022  02/12/2022 and EGD: Normal  Doing well since last visit.  She has adopted lifestyle changes that we discussed at the last visit and has had very minimal symptoms if at all from acid reflux.  She is not on any medications.  Has no new concerns.  Current Outpatient Medications  Medication Sig Dispense Refill   levothyroxine (SYNTHROID, LEVOTHROID) 75 MCG tablet Take 75 mcg by mouth daily before breakfast.     Multiple Vitamin (MULTIVITAMIN) capsule Take 1 capsule by mouth daily.     albuterol (VENTOLIN HFA) 108 (90 Base) MCG/ACT inhaler Inhale 2 puffs into the lungs every 4 (four) hours as needed. (Patient not taking: Reported on 04/29/2022) 8 g 3   SYMBICORT 160-4.5 MCG/ACT inhaler Inhale 2 puffs into the lungs 2 (two) times daily. (Patient not taking: Reported on 04/29/2022) 10.2 g 11   No current facility-administered medications for this visit.    Allergies as of 04/29/2022 - Review Complete 04/29/2022  Allergen Reaction Noted   Sulfa antibiotics  02/20/2018    ROS:  General: Negative for anorexia, weight loss, fever, chills, fatigue, weakness. ENT: Negative for hoarseness, difficulty swallowing , nasal congestion. CV: Negative for chest  pain, angina, palpitations, dyspnea on exertion, peripheral edema.  Respiratory: Negative for dyspnea at rest, dyspnea on exertion, cough, sputum, wheezing.  GI: See history of present illness. GU:  Negative for dysuria, hematuria, urinary incontinence, urinary frequency, nocturnal urination.  Endo: Negative for unusual weight change.    Physical Examination:   BP 113/74   Pulse 83   Temp 98.1 F (36.7 C) (Oral)   Wt 179 lb 9.6 oz (81.5 kg)   BMI 31.81 kg/m   General: Well-nourished, well-developed in no acute distress.  Eyes: No icterus. Conjunctivae pink. Mouth: Oropharyngeal mucosa moist and pink , no lesions erythema or exudate.  Neuro: Alert and oriented x 3.  Grossly intact. Skin: Warm and dry, no jaundice.   Psych: Alert and cooperative, normal mood and affect.   Imaging Studies: No results found.  Assessment and Plan:   Mallory Fox is a 42 y.o. y/o female here to follow-up for possible acid reflux recent EGD showed no abnormalities, her symptoms are controlled well on lifestyle changes without any medications.  I explained to her to continue the same and if she were to get symptoms to contact us and we will see her soon to discuss neck steps.  I do believe if she loses weight and follows lifestyle changes she can stay away from any acid suppressing agents which would be best    Dr Jonathon Bellows  MD,MRCP [facility]) Follow up in as needed
